# Patient Record
Sex: Male | Born: 1950 | Race: Black or African American | Hispanic: No | Marital: Married | Smoking: Never smoker
Health system: Southern US, Community
[De-identification: ages and names within clinical notes are randomized; demographics above are authoritative.]

## PROBLEM LIST (undated history)

## (undated) DIAGNOSIS — E119 Type 2 diabetes mellitus without complications: Secondary | ICD-10-CM

## (undated) DIAGNOSIS — I10 Essential (primary) hypertension: Secondary | ICD-10-CM

## (undated) DIAGNOSIS — I509 Heart failure, unspecified: Secondary | ICD-10-CM

## (undated) HISTORY — PX: ANTERIOR CRUCIATE LIGAMENT REPAIR: SHX115

---

## 2008-05-20 ENCOUNTER — Inpatient Hospital Stay: Payer: Self-pay | Admitting: Specialist

## 2012-04-01 DIAGNOSIS — Z125 Encounter for screening for malignant neoplasm of prostate: Secondary | ICD-10-CM | POA: Diagnosis not present

## 2012-04-01 DIAGNOSIS — E785 Hyperlipidemia, unspecified: Secondary | ICD-10-CM | POA: Diagnosis not present

## 2012-04-01 DIAGNOSIS — E119 Type 2 diabetes mellitus without complications: Secondary | ICD-10-CM | POA: Diagnosis not present

## 2012-04-01 DIAGNOSIS — R5383 Other fatigue: Secondary | ICD-10-CM | POA: Diagnosis not present

## 2012-04-01 DIAGNOSIS — M109 Gout, unspecified: Secondary | ICD-10-CM | POA: Diagnosis not present

## 2012-04-01 DIAGNOSIS — I10 Essential (primary) hypertension: Secondary | ICD-10-CM | POA: Diagnosis not present

## 2012-07-22 DIAGNOSIS — E119 Type 2 diabetes mellitus without complications: Secondary | ICD-10-CM | POA: Diagnosis not present

## 2012-07-22 DIAGNOSIS — H2510 Age-related nuclear cataract, unspecified eye: Secondary | ICD-10-CM | POA: Diagnosis not present

## 2012-07-22 DIAGNOSIS — H25019 Cortical age-related cataract, unspecified eye: Secondary | ICD-10-CM | POA: Diagnosis not present

## 2012-12-12 DIAGNOSIS — I10 Essential (primary) hypertension: Secondary | ICD-10-CM | POA: Diagnosis not present

## 2012-12-12 DIAGNOSIS — E119 Type 2 diabetes mellitus without complications: Secondary | ICD-10-CM | POA: Diagnosis not present

## 2012-12-12 DIAGNOSIS — R5383 Other fatigue: Secondary | ICD-10-CM | POA: Diagnosis not present

## 2012-12-12 DIAGNOSIS — E1169 Type 2 diabetes mellitus with other specified complication: Secondary | ICD-10-CM | POA: Diagnosis not present

## 2012-12-12 DIAGNOSIS — Z125 Encounter for screening for malignant neoplasm of prostate: Secondary | ICD-10-CM | POA: Diagnosis not present

## 2012-12-12 DIAGNOSIS — E785 Hyperlipidemia, unspecified: Secondary | ICD-10-CM | POA: Diagnosis not present

## 2012-12-12 DIAGNOSIS — M109 Gout, unspecified: Secondary | ICD-10-CM | POA: Diagnosis not present

## 2013-03-14 DIAGNOSIS — N529 Male erectile dysfunction, unspecified: Secondary | ICD-10-CM | POA: Diagnosis not present

## 2013-03-14 DIAGNOSIS — I10 Essential (primary) hypertension: Secondary | ICD-10-CM | POA: Diagnosis not present

## 2013-03-14 DIAGNOSIS — E785 Hyperlipidemia, unspecified: Secondary | ICD-10-CM | POA: Diagnosis not present

## 2013-06-17 DIAGNOSIS — E785 Hyperlipidemia, unspecified: Secondary | ICD-10-CM | POA: Diagnosis not present

## 2013-06-17 DIAGNOSIS — Z23 Encounter for immunization: Secondary | ICD-10-CM | POA: Diagnosis not present

## 2013-06-17 DIAGNOSIS — I10 Essential (primary) hypertension: Secondary | ICD-10-CM | POA: Diagnosis not present

## 2013-12-03 DIAGNOSIS — IMO0001 Reserved for inherently not codable concepts without codable children: Secondary | ICD-10-CM | POA: Diagnosis not present

## 2013-12-03 DIAGNOSIS — I10 Essential (primary) hypertension: Secondary | ICD-10-CM | POA: Diagnosis not present

## 2013-12-03 DIAGNOSIS — Z125 Encounter for screening for malignant neoplasm of prostate: Secondary | ICD-10-CM | POA: Diagnosis not present

## 2013-12-03 DIAGNOSIS — M109 Gout, unspecified: Secondary | ICD-10-CM | POA: Diagnosis not present

## 2013-12-03 DIAGNOSIS — E785 Hyperlipidemia, unspecified: Secondary | ICD-10-CM | POA: Diagnosis not present

## 2014-05-18 DIAGNOSIS — L0291 Cutaneous abscess, unspecified: Secondary | ICD-10-CM | POA: Diagnosis not present

## 2014-05-18 DIAGNOSIS — L0390 Cellulitis, unspecified: Secondary | ICD-10-CM | POA: Diagnosis not present

## 2014-05-28 DIAGNOSIS — L03211 Cellulitis of face: Secondary | ICD-10-CM | POA: Diagnosis not present

## 2014-05-28 DIAGNOSIS — L0201 Cutaneous abscess of face: Secondary | ICD-10-CM | POA: Diagnosis not present

## 2014-06-05 DIAGNOSIS — M109 Gout, unspecified: Secondary | ICD-10-CM | POA: Diagnosis not present

## 2014-06-05 DIAGNOSIS — E785 Hyperlipidemia, unspecified: Secondary | ICD-10-CM | POA: Diagnosis not present

## 2014-06-05 DIAGNOSIS — E1165 Type 2 diabetes mellitus with hyperglycemia: Secondary | ICD-10-CM | POA: Diagnosis not present

## 2014-06-05 DIAGNOSIS — I10 Essential (primary) hypertension: Secondary | ICD-10-CM | POA: Diagnosis not present

## 2015-01-26 DIAGNOSIS — Z1389 Encounter for screening for other disorder: Secondary | ICD-10-CM | POA: Diagnosis not present

## 2015-01-26 DIAGNOSIS — I10 Essential (primary) hypertension: Secondary | ICD-10-CM | POA: Diagnosis not present

## 2015-01-26 DIAGNOSIS — Z79899 Other long term (current) drug therapy: Secondary | ICD-10-CM | POA: Diagnosis not present

## 2015-01-26 DIAGNOSIS — Z6841 Body Mass Index (BMI) 40.0 and over, adult: Secondary | ICD-10-CM | POA: Diagnosis not present

## 2015-01-26 DIAGNOSIS — E1165 Type 2 diabetes mellitus with hyperglycemia: Secondary | ICD-10-CM | POA: Diagnosis not present

## 2015-01-26 DIAGNOSIS — E785 Hyperlipidemia, unspecified: Secondary | ICD-10-CM | POA: Diagnosis not present

## 2015-01-26 DIAGNOSIS — M109 Gout, unspecified: Secondary | ICD-10-CM | POA: Diagnosis not present

## 2015-04-23 DIAGNOSIS — N400 Enlarged prostate without lower urinary tract symptoms: Secondary | ICD-10-CM | POA: Diagnosis not present

## 2015-04-23 DIAGNOSIS — R140 Abdominal distension (gaseous): Secondary | ICD-10-CM | POA: Diagnosis not present

## 2015-04-23 DIAGNOSIS — R319 Hematuria, unspecified: Secondary | ICD-10-CM | POA: Diagnosis not present

## 2015-04-23 DIAGNOSIS — R509 Fever, unspecified: Secondary | ICD-10-CM | POA: Diagnosis not present

## 2015-04-23 DIAGNOSIS — K529 Noninfective gastroenteritis and colitis, unspecified: Secondary | ICD-10-CM | POA: Diagnosis not present

## 2015-04-23 DIAGNOSIS — R05 Cough: Secondary | ICD-10-CM | POA: Diagnosis not present

## 2015-04-24 DIAGNOSIS — R319 Hematuria, unspecified: Secondary | ICD-10-CM | POA: Diagnosis not present

## 2015-04-24 DIAGNOSIS — R140 Abdominal distension (gaseous): Secondary | ICD-10-CM | POA: Diagnosis not present

## 2015-04-24 DIAGNOSIS — N400 Enlarged prostate without lower urinary tract symptoms: Secondary | ICD-10-CM | POA: Diagnosis not present

## 2015-04-24 DIAGNOSIS — R05 Cough: Secondary | ICD-10-CM | POA: Diagnosis not present

## 2015-04-24 DIAGNOSIS — R509 Fever, unspecified: Secondary | ICD-10-CM | POA: Diagnosis not present

## 2015-06-29 DIAGNOSIS — Z6841 Body Mass Index (BMI) 40.0 and over, adult: Secondary | ICD-10-CM | POA: Diagnosis not present

## 2015-06-29 DIAGNOSIS — Z79899 Other long term (current) drug therapy: Secondary | ICD-10-CM | POA: Diagnosis not present

## 2015-06-29 DIAGNOSIS — E1165 Type 2 diabetes mellitus with hyperglycemia: Secondary | ICD-10-CM | POA: Diagnosis not present

## 2015-06-29 DIAGNOSIS — N529 Male erectile dysfunction, unspecified: Secondary | ICD-10-CM | POA: Diagnosis not present

## 2015-06-29 DIAGNOSIS — Z23 Encounter for immunization: Secondary | ICD-10-CM | POA: Diagnosis not present

## 2015-06-29 DIAGNOSIS — I10 Essential (primary) hypertension: Secondary | ICD-10-CM | POA: Diagnosis not present

## 2015-06-29 DIAGNOSIS — E785 Hyperlipidemia, unspecified: Secondary | ICD-10-CM | POA: Diagnosis not present

## 2015-06-29 DIAGNOSIS — M109 Gout, unspecified: Secondary | ICD-10-CM | POA: Diagnosis not present

## 2016-02-10 DIAGNOSIS — I10 Essential (primary) hypertension: Secondary | ICD-10-CM | POA: Diagnosis not present

## 2016-02-10 DIAGNOSIS — Z79899 Other long term (current) drug therapy: Secondary | ICD-10-CM | POA: Diagnosis not present

## 2016-02-10 DIAGNOSIS — Z6841 Body Mass Index (BMI) 40.0 and over, adult: Secondary | ICD-10-CM | POA: Diagnosis not present

## 2016-02-10 DIAGNOSIS — E1165 Type 2 diabetes mellitus with hyperglycemia: Secondary | ICD-10-CM | POA: Diagnosis not present

## 2016-02-10 DIAGNOSIS — Z1389 Encounter for screening for other disorder: Secondary | ICD-10-CM | POA: Diagnosis not present

## 2016-02-10 DIAGNOSIS — E785 Hyperlipidemia, unspecified: Secondary | ICD-10-CM | POA: Diagnosis not present

## 2016-02-10 DIAGNOSIS — N529 Male erectile dysfunction, unspecified: Secondary | ICD-10-CM | POA: Diagnosis not present

## 2016-02-10 DIAGNOSIS — Z125 Encounter for screening for malignant neoplasm of prostate: Secondary | ICD-10-CM | POA: Diagnosis not present

## 2016-02-10 DIAGNOSIS — Z9181 History of falling: Secondary | ICD-10-CM | POA: Diagnosis not present

## 2016-02-29 DIAGNOSIS — J208 Acute bronchitis due to other specified organisms: Secondary | ICD-10-CM | POA: Diagnosis not present

## 2016-02-29 DIAGNOSIS — R0602 Shortness of breath: Secondary | ICD-10-CM | POA: Diagnosis not present

## 2016-02-29 DIAGNOSIS — J181 Lobar pneumonia, unspecified organism: Secondary | ICD-10-CM | POA: Diagnosis not present

## 2016-02-29 DIAGNOSIS — R05 Cough: Secondary | ICD-10-CM | POA: Diagnosis not present

## 2016-03-16 DIAGNOSIS — I517 Cardiomegaly: Secondary | ICD-10-CM | POA: Diagnosis not present

## 2016-03-16 DIAGNOSIS — R05 Cough: Secondary | ICD-10-CM | POA: Diagnosis not present

## 2016-03-16 DIAGNOSIS — J189 Pneumonia, unspecified organism: Secondary | ICD-10-CM | POA: Diagnosis not present

## 2016-03-17 DIAGNOSIS — J189 Pneumonia, unspecified organism: Secondary | ICD-10-CM | POA: Diagnosis not present

## 2016-03-17 DIAGNOSIS — Z79899 Other long term (current) drug therapy: Secondary | ICD-10-CM | POA: Diagnosis not present

## 2016-03-17 DIAGNOSIS — Z6841 Body Mass Index (BMI) 40.0 and over, adult: Secondary | ICD-10-CM | POA: Diagnosis not present

## 2016-08-03 DIAGNOSIS — Z6841 Body Mass Index (BMI) 40.0 and over, adult: Secondary | ICD-10-CM | POA: Diagnosis not present

## 2016-08-03 DIAGNOSIS — E1165 Type 2 diabetes mellitus with hyperglycemia: Secondary | ICD-10-CM | POA: Diagnosis not present

## 2016-08-03 DIAGNOSIS — N529 Male erectile dysfunction, unspecified: Secondary | ICD-10-CM | POA: Diagnosis not present

## 2016-08-03 DIAGNOSIS — Z23 Encounter for immunization: Secondary | ICD-10-CM | POA: Diagnosis not present

## 2016-08-03 DIAGNOSIS — Z79899 Other long term (current) drug therapy: Secondary | ICD-10-CM | POA: Diagnosis not present

## 2016-08-03 DIAGNOSIS — E785 Hyperlipidemia, unspecified: Secondary | ICD-10-CM | POA: Diagnosis not present

## 2016-08-03 DIAGNOSIS — I10 Essential (primary) hypertension: Secondary | ICD-10-CM | POA: Diagnosis not present

## 2016-08-24 ENCOUNTER — Emergency Department (HOSPITAL_COMMUNITY)
Admission: EM | Admit: 2016-08-24 | Discharge: 2016-08-24 | Disposition: A | Discharge status: Home / Self Care | Payer: No Typology Code available for payment source | Attending: Emergency Medicine | Admitting: Emergency Medicine

## 2016-08-24 ENCOUNTER — Emergency Department (HOSPITAL_COMMUNITY): Payer: No Typology Code available for payment source

## 2016-08-24 ENCOUNTER — Encounter (HOSPITAL_COMMUNITY): Payer: Self-pay | Admitting: Emergency Medicine

## 2016-08-24 DIAGNOSIS — I509 Heart failure, unspecified: Secondary | ICD-10-CM | POA: Insufficient documentation

## 2016-08-24 DIAGNOSIS — E119 Type 2 diabetes mellitus without complications: Secondary | ICD-10-CM | POA: Insufficient documentation

## 2016-08-24 DIAGNOSIS — Y939 Activity, unspecified: Secondary | ICD-10-CM | POA: Diagnosis not present

## 2016-08-24 DIAGNOSIS — S01512A Laceration without foreign body of oral cavity, initial encounter: Secondary | ICD-10-CM | POA: Diagnosis not present

## 2016-08-24 DIAGNOSIS — Z79899 Other long term (current) drug therapy: Secondary | ICD-10-CM | POA: Diagnosis not present

## 2016-08-24 DIAGNOSIS — Y999 Unspecified external cause status: Secondary | ICD-10-CM | POA: Insufficient documentation

## 2016-08-24 DIAGNOSIS — R0781 Pleurodynia: Secondary | ICD-10-CM | POA: Diagnosis not present

## 2016-08-24 DIAGNOSIS — M542 Cervicalgia: Secondary | ICD-10-CM | POA: Diagnosis not present

## 2016-08-24 DIAGNOSIS — Y92410 Unspecified street and highway as the place of occurrence of the external cause: Secondary | ICD-10-CM | POA: Insufficient documentation

## 2016-08-24 DIAGNOSIS — S299XXA Unspecified injury of thorax, initial encounter: Secondary | ICD-10-CM | POA: Diagnosis not present

## 2016-08-24 DIAGNOSIS — T148XXA Other injury of unspecified body region, initial encounter: Secondary | ICD-10-CM | POA: Diagnosis not present

## 2016-08-24 DIAGNOSIS — M25552 Pain in left hip: Secondary | ICD-10-CM | POA: Insufficient documentation

## 2016-08-24 DIAGNOSIS — I110 Hypertensive heart disease with heart failure: Secondary | ICD-10-CM | POA: Insufficient documentation

## 2016-08-24 DIAGNOSIS — M545 Low back pain: Secondary | ICD-10-CM | POA: Diagnosis not present

## 2016-08-24 DIAGNOSIS — S79912A Unspecified injury of left hip, initial encounter: Secondary | ICD-10-CM | POA: Diagnosis not present

## 2016-08-24 DIAGNOSIS — S3992XA Unspecified injury of lower back, initial encounter: Secondary | ICD-10-CM | POA: Diagnosis not present

## 2016-08-24 HISTORY — DX: Type 2 diabetes mellitus without complications: E11.9

## 2016-08-24 HISTORY — DX: Heart failure, unspecified: I50.9

## 2016-08-24 HISTORY — DX: Essential (primary) hypertension: I10

## 2016-08-24 MED ORDER — LIDOCAINE 5 % EX PTCH: 1.0000 | MEDICATED_PATCH | CUTANEOUS | 0 refills | Status: AC

## 2016-08-24 MED ORDER — METHOCARBAMOL 500 MG PO TABS
1000.0000 mg | ORAL_TABLET | Freq: Once | ORAL | Status: AC
  Administered 2016-08-24: 1000 mg via ORAL
  Filled 2016-08-24: qty 2

## 2016-08-24 MED ORDER — METHOCARBAMOL 500 MG PO TABS: 500.0000 mg | ORAL_TABLET | Freq: Two times a day (BID) | ORAL | 0 refills | Status: DC

## 2016-08-24 MED ORDER — NAPROXEN 500 MG PO TABS: 500.0000 mg | ORAL_TABLET | Freq: Two times a day (BID) | ORAL | 0 refills | Status: DC

## 2016-08-24 MED ORDER — KETOROLAC TROMETHAMINE 60 MG/2ML IM SOLN
60.0000 mg | Freq: Once | INTRAMUSCULAR | Status: AC
  Administered 2016-08-24: 60 mg via INTRAMUSCULAR
  Filled 2016-08-24: qty 2

## 2016-08-24 NOTE — Discharge Instructions (Addendum)
There were no acute abnormalities on the x-rays today. Expect your soreness to increase over the next 2-3 days. Take it easy, but do not lay around too much as this may make any stiffness worse. Take 500 mg of naproxen every 12 hours or 800 mg of ibuprofen every 8 hours for the next 3 days. Take these medications with food to avoid upset stomach. Robaxin is a muscle relaxer and may help loosen stiff muscles. Do not take the Robaxin while driving or performing other dangerous activities. Be sure to perform the attached exercises starting with three times a week and working up to performing them daily. This is an essential part of preventing long term problems. Follow up with a primary care provider for any future management of these complaints.
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
## 2016-11-13 ENCOUNTER — Institutional Professional Consult (permissible substitution): Payer: Medicare Other | Admitting: Sports Medicine

## 2016-11-20 ENCOUNTER — Ambulatory Visit (INDEPENDENT_AMBULATORY_CARE_PROVIDER_SITE_OTHER): Payer: Medicare Other | Admitting: Sports Medicine

## 2016-11-20 DIAGNOSIS — M47816 Spondylosis without myelopathy or radiculopathy, lumbar region: Secondary | ICD-10-CM | POA: Diagnosis not present

## 2016-11-20 DIAGNOSIS — E882 Lipomatosis, not elsewhere classified: Secondary | ICD-10-CM | POA: Insufficient documentation

## 2016-11-20 DIAGNOSIS — D1779 Benign lipomatous neoplasm of other sites: Secondary | ICD-10-CM | POA: Diagnosis not present

## 2016-11-20 MED ORDER — MELOXICAM 15 MG PO TABS: ORAL_TABLET | ORAL | 3 refills | Status: AC

## 2016-11-20 NOTE — Assessment & Plan Note (Signed)
We can just watch this for now.

## 2016-11-20 NOTE — Assessment & Plan Note (Signed)
Referral to bariatric surgery, I would also like him to talk to his primary care provider about weight loss medications.
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
## 2016-11-20 NOTE — Assessment & Plan Note (Signed)
With facet arthritis at the bottom 2 levels, L5-S1 degenerative disc disease, and epidural lipomatosis. Overall doing well with very little pain, pain when present is discogenic. Nothing radicular, no cauda equina symptoms. Adding meloxicam, I would also like him to lose some weight.

## 2016-12-19 DIAGNOSIS — E785 Hyperlipidemia, unspecified: Secondary | ICD-10-CM | POA: Diagnosis not present

## 2016-12-19 DIAGNOSIS — M48061 Spinal stenosis, lumbar region without neurogenic claudication: Secondary | ICD-10-CM | POA: Diagnosis not present

## 2016-12-19 DIAGNOSIS — E1165 Type 2 diabetes mellitus with hyperglycemia: Secondary | ICD-10-CM | POA: Diagnosis not present

## 2016-12-19 DIAGNOSIS — Z6841 Body Mass Index (BMI) 40.0 and over, adult: Secondary | ICD-10-CM | POA: Diagnosis not present

## 2016-12-19 DIAGNOSIS — Z79899 Other long term (current) drug therapy: Secondary | ICD-10-CM | POA: Diagnosis not present

## 2016-12-19 DIAGNOSIS — R635 Abnormal weight gain: Secondary | ICD-10-CM | POA: Diagnosis not present

## 2016-12-19 DIAGNOSIS — N529 Male erectile dysfunction, unspecified: Secondary | ICD-10-CM | POA: Diagnosis not present

## 2016-12-19 DIAGNOSIS — I10 Essential (primary) hypertension: Secondary | ICD-10-CM | POA: Diagnosis not present

## 2017-01-18 DIAGNOSIS — E785 Hyperlipidemia, unspecified: Secondary | ICD-10-CM | POA: Diagnosis not present

## 2017-01-18 DIAGNOSIS — Z6841 Body Mass Index (BMI) 40.0 and over, adult: Secondary | ICD-10-CM | POA: Diagnosis not present

## 2017-01-18 DIAGNOSIS — N529 Male erectile dysfunction, unspecified: Secondary | ICD-10-CM | POA: Diagnosis not present

## 2017-01-18 DIAGNOSIS — E1165 Type 2 diabetes mellitus with hyperglycemia: Secondary | ICD-10-CM | POA: Diagnosis not present

## 2017-01-18 DIAGNOSIS — R635 Abnormal weight gain: Secondary | ICD-10-CM | POA: Diagnosis not present

## 2017-01-18 DIAGNOSIS — M48061 Spinal stenosis, lumbar region without neurogenic claudication: Secondary | ICD-10-CM | POA: Diagnosis not present

## 2017-01-18 DIAGNOSIS — Z79899 Other long term (current) drug therapy: Secondary | ICD-10-CM | POA: Diagnosis not present

## 2017-01-18 DIAGNOSIS — I10 Essential (primary) hypertension: Secondary | ICD-10-CM | POA: Diagnosis not present

## 2017-02-21 DIAGNOSIS — Z79899 Other long term (current) drug therapy: Secondary | ICD-10-CM | POA: Diagnosis not present

## 2017-02-21 DIAGNOSIS — Z6841 Body Mass Index (BMI) 40.0 and over, adult: Secondary | ICD-10-CM | POA: Diagnosis not present

## 2017-02-21 DIAGNOSIS — R635 Abnormal weight gain: Secondary | ICD-10-CM | POA: Diagnosis not present

## 2017-02-22 DIAGNOSIS — H25013 Cortical age-related cataract, bilateral: Secondary | ICD-10-CM | POA: Diagnosis not present

## 2017-02-22 DIAGNOSIS — E119 Type 2 diabetes mellitus without complications: Secondary | ICD-10-CM | POA: Diagnosis not present

## 2017-03-28 DIAGNOSIS — Z79899 Other long term (current) drug therapy: Secondary | ICD-10-CM | POA: Diagnosis not present

## 2017-03-28 DIAGNOSIS — Z1389 Encounter for screening for other disorder: Secondary | ICD-10-CM | POA: Diagnosis not present

## 2017-03-28 DIAGNOSIS — Z6841 Body Mass Index (BMI) 40.0 and over, adult: Secondary | ICD-10-CM | POA: Diagnosis not present

## 2017-03-28 DIAGNOSIS — R635 Abnormal weight gain: Secondary | ICD-10-CM | POA: Diagnosis not present

## 2017-03-28 DIAGNOSIS — Z9181 History of falling: Secondary | ICD-10-CM | POA: Diagnosis not present

## 2017-04-30 DIAGNOSIS — Z6841 Body Mass Index (BMI) 40.0 and over, adult: Secondary | ICD-10-CM | POA: Diagnosis not present

## 2017-04-30 DIAGNOSIS — Z79899 Other long term (current) drug therapy: Secondary | ICD-10-CM | POA: Diagnosis not present

## 2017-04-30 DIAGNOSIS — R635 Abnormal weight gain: Secondary | ICD-10-CM | POA: Diagnosis not present

## 2017-05-31 DIAGNOSIS — Z6841 Body Mass Index (BMI) 40.0 and over, adult: Secondary | ICD-10-CM | POA: Diagnosis not present

## 2017-05-31 DIAGNOSIS — I10 Essential (primary) hypertension: Secondary | ICD-10-CM | POA: Diagnosis not present

## 2017-05-31 DIAGNOSIS — Z125 Encounter for screening for malignant neoplasm of prostate: Secondary | ICD-10-CM | POA: Diagnosis not present

## 2017-05-31 DIAGNOSIS — E1165 Type 2 diabetes mellitus with hyperglycemia: Secondary | ICD-10-CM | POA: Diagnosis not present

## 2017-05-31 DIAGNOSIS — R635 Abnormal weight gain: Secondary | ICD-10-CM | POA: Diagnosis not present

## 2017-05-31 DIAGNOSIS — E785 Hyperlipidemia, unspecified: Secondary | ICD-10-CM | POA: Diagnosis not present

## 2017-05-31 DIAGNOSIS — Z79899 Other long term (current) drug therapy: Secondary | ICD-10-CM | POA: Diagnosis not present

## 2017-07-03 DIAGNOSIS — Z6841 Body Mass Index (BMI) 40.0 and over, adult: Secondary | ICD-10-CM | POA: Diagnosis not present

## 2017-07-03 DIAGNOSIS — R635 Abnormal weight gain: Secondary | ICD-10-CM | POA: Diagnosis not present

## 2017-07-03 DIAGNOSIS — Z79899 Other long term (current) drug therapy: Secondary | ICD-10-CM | POA: Diagnosis not present

## 2017-07-03 DIAGNOSIS — Z23 Encounter for immunization: Secondary | ICD-10-CM | POA: Diagnosis not present

## 2017-08-02 DIAGNOSIS — R635 Abnormal weight gain: Secondary | ICD-10-CM | POA: Diagnosis not present

## 2017-08-02 DIAGNOSIS — Z6841 Body Mass Index (BMI) 40.0 and over, adult: Secondary | ICD-10-CM | POA: Diagnosis not present

## 2017-08-02 DIAGNOSIS — Z79899 Other long term (current) drug therapy: Secondary | ICD-10-CM | POA: Diagnosis not present

## 2017-08-29 DIAGNOSIS — J069 Acute upper respiratory infection, unspecified: Secondary | ICD-10-CM | POA: Diagnosis not present

## 2017-08-29 DIAGNOSIS — Z6841 Body Mass Index (BMI) 40.0 and over, adult: Secondary | ICD-10-CM | POA: Diagnosis not present

## 2017-10-02 DIAGNOSIS — Z79899 Other long term (current) drug therapy: Secondary | ICD-10-CM | POA: Diagnosis not present

## 2017-10-02 DIAGNOSIS — R635 Abnormal weight gain: Secondary | ICD-10-CM | POA: Diagnosis not present

## 2017-10-02 DIAGNOSIS — Z6841 Body Mass Index (BMI) 40.0 and over, adult: Secondary | ICD-10-CM | POA: Diagnosis not present

## 2017-10-02 DIAGNOSIS — S91332A Puncture wound without foreign body, left foot, initial encounter: Secondary | ICD-10-CM | POA: Diagnosis not present

## 2017-11-06 DIAGNOSIS — R635 Abnormal weight gain: Secondary | ICD-10-CM | POA: Diagnosis not present

## 2017-11-06 DIAGNOSIS — S91312A Laceration without foreign body, left foot, initial encounter: Secondary | ICD-10-CM | POA: Diagnosis not present

## 2017-11-06 DIAGNOSIS — Z6841 Body Mass Index (BMI) 40.0 and over, adult: Secondary | ICD-10-CM | POA: Diagnosis not present

## 2017-11-06 DIAGNOSIS — Z79899 Other long term (current) drug therapy: Secondary | ICD-10-CM | POA: Diagnosis not present

## 2017-11-13 DIAGNOSIS — E785 Hyperlipidemia, unspecified: Secondary | ICD-10-CM | POA: Diagnosis not present

## 2017-11-13 DIAGNOSIS — E669 Obesity, unspecified: Secondary | ICD-10-CM | POA: Diagnosis not present

## 2017-11-13 DIAGNOSIS — Z0000 Encounter for general adult medical examination without abnormal findings: Secondary | ICD-10-CM | POA: Diagnosis not present

## 2017-11-13 DIAGNOSIS — Z125 Encounter for screening for malignant neoplasm of prostate: Secondary | ICD-10-CM | POA: Diagnosis not present

## 2017-11-13 DIAGNOSIS — Z6841 Body Mass Index (BMI) 40.0 and over, adult: Secondary | ICD-10-CM | POA: Diagnosis not present

## 2017-11-20 DIAGNOSIS — I10 Essential (primary) hypertension: Secondary | ICD-10-CM | POA: Diagnosis not present

## 2017-11-20 DIAGNOSIS — Z79899 Other long term (current) drug therapy: Secondary | ICD-10-CM | POA: Diagnosis not present

## 2017-11-20 DIAGNOSIS — Z6841 Body Mass Index (BMI) 40.0 and over, adult: Secondary | ICD-10-CM | POA: Diagnosis not present

## 2017-11-20 DIAGNOSIS — E785 Hyperlipidemia, unspecified: Secondary | ICD-10-CM | POA: Diagnosis not present

## 2017-11-20 DIAGNOSIS — E1165 Type 2 diabetes mellitus with hyperglycemia: Secondary | ICD-10-CM | POA: Diagnosis not present

## 2017-11-29 ENCOUNTER — Other Ambulatory Visit: Payer: Self-pay

## 2017-11-29 ENCOUNTER — Emergency Department (HOSPITAL_COMMUNITY): Payer: Medicare Other

## 2017-11-29 ENCOUNTER — Encounter (HOSPITAL_COMMUNITY): Payer: Self-pay | Admitting: Emergency Medicine

## 2017-11-29 ENCOUNTER — Emergency Department (HOSPITAL_COMMUNITY)
Admission: EM | Admit: 2017-11-29 | Discharge: 2017-11-30 | Disposition: A | Discharge status: Home / Self Care | Payer: Medicare Other | Attending: Emergency Medicine | Admitting: Emergency Medicine

## 2017-11-29 DIAGNOSIS — R0602 Shortness of breath: Secondary | ICD-10-CM | POA: Insufficient documentation

## 2017-11-29 DIAGNOSIS — M7989 Other specified soft tissue disorders: Secondary | ICD-10-CM | POA: Diagnosis not present

## 2017-11-29 DIAGNOSIS — R05 Cough: Secondary | ICD-10-CM | POA: Diagnosis not present

## 2017-11-29 DIAGNOSIS — Z5321 Procedure and treatment not carried out due to patient leaving prior to being seen by health care provider: Secondary | ICD-10-CM | POA: Diagnosis not present

## 2017-11-29 LAB — CBC
HEMATOCRIT: 35 % — AB (ref 39.0–52.0)
HEMOGLOBIN: 11.1 g/dL — AB (ref 13.0–17.0)
MCH: 26.2 pg (ref 26.0–34.0)
MCHC: 31.7 g/dL (ref 30.0–36.0)
MCV: 82.7 fL (ref 78.0–100.0)
Platelets: 226 10*3/uL (ref 150–400)
RBC: 4.23 MIL/uL (ref 4.22–5.81)
RDW: 16.2 % — AB (ref 11.5–15.5)
WBC: 7.3 10*3/uL (ref 4.0–10.5)

## 2017-11-29 LAB — BASIC METABOLIC PANEL
ANION GAP: 12 (ref 5–15)
BUN: 14 mg/dL (ref 6–20)
CALCIUM: 9 mg/dL (ref 8.9–10.3)
CHLORIDE: 100 mmol/L — AB (ref 101–111)
CO2: 26 mmol/L (ref 22–32)
Creatinine, Ser: 1.31 mg/dL — ABNORMAL HIGH (ref 0.61–1.24)
GFR calc non Af Amer: 56 mL/min — ABNORMAL LOW (ref >60)
GLUCOSE: 250 mg/dL — AB (ref 65–99)
POTASSIUM: 3.7 mmol/L (ref 3.5–5.1)
Sodium: 138 mmol/L (ref 135–145)

## 2017-11-29 LAB — BRAIN NATRIURETIC PEPTIDE: B Natriuretic Peptide: 256.4 pg/mL — ABNORMAL HIGH (ref 0.0–100.0)

## 2017-11-29 NOTE — ED Triage Notes (Signed)
Pt reports shortness of breath x1 week, progressively worse. States cough starting more recently. Dry cough. Reports bilateral leg swelling

## 2017-11-30 DIAGNOSIS — I10 Essential (primary) hypertension: Secondary | ICD-10-CM | POA: Diagnosis not present

## 2017-11-30 DIAGNOSIS — J90 Pleural effusion, not elsewhere classified: Secondary | ICD-10-CM | POA: Diagnosis not present

## 2017-11-30 DIAGNOSIS — E785 Hyperlipidemia, unspecified: Secondary | ICD-10-CM | POA: Diagnosis not present

## 2017-11-30 DIAGNOSIS — E1165 Type 2 diabetes mellitus with hyperglycemia: Secondary | ICD-10-CM | POA: Diagnosis not present

## 2017-11-30 DIAGNOSIS — Z6841 Body Mass Index (BMI) 40.0 and over, adult: Secondary | ICD-10-CM | POA: Diagnosis not present

## 2017-11-30 NOTE — ED Notes (Signed)
No answer when called by NT-Monique,RN

## 2017-12-06 DIAGNOSIS — Z6841 Body Mass Index (BMI) 40.0 and over, adult: Secondary | ICD-10-CM | POA: Diagnosis not present

## 2017-12-06 DIAGNOSIS — D649 Anemia, unspecified: Secondary | ICD-10-CM | POA: Diagnosis not present

## 2017-12-06 DIAGNOSIS — J90 Pleural effusion, not elsewhere classified: Secondary | ICD-10-CM | POA: Diagnosis not present

## 2017-12-14 DIAGNOSIS — Z7984 Long term (current) use of oral hypoglycemic drugs: Secondary | ICD-10-CM | POA: Diagnosis not present

## 2017-12-14 DIAGNOSIS — I110 Hypertensive heart disease with heart failure: Secondary | ICD-10-CM | POA: Diagnosis not present

## 2017-12-14 DIAGNOSIS — E11621 Type 2 diabetes mellitus with foot ulcer: Secondary | ICD-10-CM | POA: Diagnosis not present

## 2017-12-14 DIAGNOSIS — L97522 Non-pressure chronic ulcer of other part of left foot with fat layer exposed: Secondary | ICD-10-CM | POA: Diagnosis not present

## 2017-12-14 DIAGNOSIS — I509 Heart failure, unspecified: Secondary | ICD-10-CM | POA: Diagnosis not present

## 2017-12-14 DIAGNOSIS — M109 Gout, unspecified: Secondary | ICD-10-CM | POA: Diagnosis not present

## 2017-12-20 DIAGNOSIS — E11621 Type 2 diabetes mellitus with foot ulcer: Secondary | ICD-10-CM | POA: Diagnosis not present

## 2017-12-20 DIAGNOSIS — I10 Essential (primary) hypertension: Secondary | ICD-10-CM | POA: Diagnosis not present

## 2017-12-20 DIAGNOSIS — L97522 Non-pressure chronic ulcer of other part of left foot with fat layer exposed: Secondary | ICD-10-CM | POA: Diagnosis not present

## 2017-12-27 DIAGNOSIS — L97522 Non-pressure chronic ulcer of other part of left foot with fat layer exposed: Secondary | ICD-10-CM | POA: Diagnosis not present

## 2017-12-27 DIAGNOSIS — I10 Essential (primary) hypertension: Secondary | ICD-10-CM | POA: Diagnosis not present

## 2017-12-27 DIAGNOSIS — E11621 Type 2 diabetes mellitus with foot ulcer: Secondary | ICD-10-CM | POA: Diagnosis not present

## 2017-12-27 DIAGNOSIS — L97822 Non-pressure chronic ulcer of other part of left lower leg with fat layer exposed: Secondary | ICD-10-CM | POA: Diagnosis not present

## 2018-01-03 DIAGNOSIS — E11621 Type 2 diabetes mellitus with foot ulcer: Secondary | ICD-10-CM | POA: Diagnosis not present

## 2018-01-03 DIAGNOSIS — I10 Essential (primary) hypertension: Secondary | ICD-10-CM | POA: Diagnosis not present

## 2018-01-03 DIAGNOSIS — L97522 Non-pressure chronic ulcer of other part of left foot with fat layer exposed: Secondary | ICD-10-CM | POA: Diagnosis not present

## 2018-01-03 DIAGNOSIS — L97822 Non-pressure chronic ulcer of other part of left lower leg with fat layer exposed: Secondary | ICD-10-CM | POA: Diagnosis not present

## 2018-01-10 DIAGNOSIS — E11621 Type 2 diabetes mellitus with foot ulcer: Secondary | ICD-10-CM | POA: Diagnosis not present

## 2018-01-10 DIAGNOSIS — I10 Essential (primary) hypertension: Secondary | ICD-10-CM | POA: Diagnosis not present

## 2018-01-10 DIAGNOSIS — L97522 Non-pressure chronic ulcer of other part of left foot with fat layer exposed: Secondary | ICD-10-CM | POA: Diagnosis not present

## 2018-01-17 DIAGNOSIS — E11621 Type 2 diabetes mellitus with foot ulcer: Secondary | ICD-10-CM | POA: Diagnosis not present

## 2018-01-17 DIAGNOSIS — L97522 Non-pressure chronic ulcer of other part of left foot with fat layer exposed: Secondary | ICD-10-CM | POA: Diagnosis not present

## 2018-01-21 DIAGNOSIS — E11621 Type 2 diabetes mellitus with foot ulcer: Secondary | ICD-10-CM | POA: Diagnosis not present

## 2018-01-21 DIAGNOSIS — L97522 Non-pressure chronic ulcer of other part of left foot with fat layer exposed: Secondary | ICD-10-CM | POA: Diagnosis not present

## 2018-01-23 DIAGNOSIS — J90 Pleural effusion, not elsewhere classified: Secondary | ICD-10-CM | POA: Diagnosis not present

## 2018-01-23 DIAGNOSIS — R609 Edema, unspecified: Secondary | ICD-10-CM | POA: Diagnosis not present

## 2018-01-23 DIAGNOSIS — I509 Heart failure, unspecified: Secondary | ICD-10-CM | POA: Diagnosis not present

## 2018-01-23 DIAGNOSIS — Z1211 Encounter for screening for malignant neoplasm of colon: Secondary | ICD-10-CM | POA: Diagnosis not present

## 2018-01-24 DIAGNOSIS — E11621 Type 2 diabetes mellitus with foot ulcer: Secondary | ICD-10-CM | POA: Diagnosis not present

## 2018-01-24 DIAGNOSIS — L97522 Non-pressure chronic ulcer of other part of left foot with fat layer exposed: Secondary | ICD-10-CM | POA: Diagnosis not present

## 2018-01-29 DIAGNOSIS — L97522 Non-pressure chronic ulcer of other part of left foot with fat layer exposed: Secondary | ICD-10-CM | POA: Diagnosis not present

## 2018-01-29 DIAGNOSIS — E11621 Type 2 diabetes mellitus with foot ulcer: Secondary | ICD-10-CM | POA: Diagnosis not present

## 2018-01-31 DIAGNOSIS — E11621 Type 2 diabetes mellitus with foot ulcer: Secondary | ICD-10-CM | POA: Diagnosis not present

## 2018-01-31 DIAGNOSIS — E119 Type 2 diabetes mellitus without complications: Secondary | ICD-10-CM | POA: Diagnosis not present

## 2018-01-31 DIAGNOSIS — L97529 Non-pressure chronic ulcer of other part of left foot with unspecified severity: Secondary | ICD-10-CM | POA: Diagnosis not present

## 2018-02-04 DIAGNOSIS — E11621 Type 2 diabetes mellitus with foot ulcer: Secondary | ICD-10-CM | POA: Diagnosis not present

## 2018-02-04 DIAGNOSIS — L97522 Non-pressure chronic ulcer of other part of left foot with fat layer exposed: Secondary | ICD-10-CM | POA: Diagnosis not present

## 2018-02-06 DIAGNOSIS — Z79899 Other long term (current) drug therapy: Secondary | ICD-10-CM | POA: Diagnosis not present

## 2018-02-06 DIAGNOSIS — R609 Edema, unspecified: Secondary | ICD-10-CM | POA: Diagnosis not present

## 2018-02-06 DIAGNOSIS — Z6841 Body Mass Index (BMI) 40.0 and over, adult: Secondary | ICD-10-CM | POA: Diagnosis not present

## 2018-02-13 DIAGNOSIS — L97522 Non-pressure chronic ulcer of other part of left foot with fat layer exposed: Secondary | ICD-10-CM | POA: Diagnosis not present

## 2018-02-13 DIAGNOSIS — E11621 Type 2 diabetes mellitus with foot ulcer: Secondary | ICD-10-CM | POA: Diagnosis not present

## 2018-02-20 DIAGNOSIS — Z09 Encounter for follow-up examination after completed treatment for conditions other than malignant neoplasm: Secondary | ICD-10-CM | POA: Diagnosis not present

## 2018-02-20 DIAGNOSIS — I10 Essential (primary) hypertension: Secondary | ICD-10-CM | POA: Diagnosis not present

## 2018-02-20 DIAGNOSIS — L97529 Non-pressure chronic ulcer of other part of left foot with unspecified severity: Secondary | ICD-10-CM | POA: Diagnosis not present

## 2018-02-20 DIAGNOSIS — E119 Type 2 diabetes mellitus without complications: Secondary | ICD-10-CM | POA: Diagnosis not present

## 2018-02-20 DIAGNOSIS — Z872 Personal history of diseases of the skin and subcutaneous tissue: Secondary | ICD-10-CM | POA: Diagnosis not present

## 2018-05-07 DIAGNOSIS — Z6841 Body Mass Index (BMI) 40.0 and over, adult: Secondary | ICD-10-CM | POA: Diagnosis not present

## 2018-05-07 DIAGNOSIS — E785 Hyperlipidemia, unspecified: Secondary | ICD-10-CM | POA: Diagnosis not present

## 2018-05-07 DIAGNOSIS — R0602 Shortness of breath: Secondary | ICD-10-CM | POA: Diagnosis not present

## 2018-05-07 DIAGNOSIS — E119 Type 2 diabetes mellitus without complications: Secondary | ICD-10-CM | POA: Diagnosis not present

## 2018-05-08 DIAGNOSIS — I454 Nonspecific intraventricular block: Secondary | ICD-10-CM | POA: Diagnosis not present

## 2018-05-13 DIAGNOSIS — E785 Hyperlipidemia, unspecified: Secondary | ICD-10-CM | POA: Diagnosis not present

## 2018-05-13 DIAGNOSIS — R0602 Shortness of breath: Secondary | ICD-10-CM | POA: Diagnosis not present

## 2018-05-29 DIAGNOSIS — R5383 Other fatigue: Secondary | ICD-10-CM | POA: Diagnosis not present

## 2018-05-29 DIAGNOSIS — E559 Vitamin D deficiency, unspecified: Secondary | ICD-10-CM | POA: Diagnosis not present

## 2018-05-29 DIAGNOSIS — J4530 Mild persistent asthma, uncomplicated: Secondary | ICD-10-CM | POA: Diagnosis not present

## 2018-05-29 DIAGNOSIS — G4733 Obstructive sleep apnea (adult) (pediatric): Secondary | ICD-10-CM | POA: Diagnosis not present

## 2018-06-11 DIAGNOSIS — E119 Type 2 diabetes mellitus without complications: Secondary | ICD-10-CM | POA: Diagnosis not present

## 2018-06-11 DIAGNOSIS — Z6841 Body Mass Index (BMI) 40.0 and over, adult: Secondary | ICD-10-CM | POA: Diagnosis not present

## 2018-06-11 DIAGNOSIS — E785 Hyperlipidemia, unspecified: Secondary | ICD-10-CM | POA: Diagnosis not present

## 2018-06-11 DIAGNOSIS — I420 Dilated cardiomyopathy: Secondary | ICD-10-CM | POA: Diagnosis not present

## 2018-06-12 DIAGNOSIS — Z6841 Body Mass Index (BMI) 40.0 and over, adult: Secondary | ICD-10-CM | POA: Diagnosis not present

## 2018-06-12 DIAGNOSIS — Z23 Encounter for immunization: Secondary | ICD-10-CM | POA: Diagnosis not present

## 2018-06-12 DIAGNOSIS — I10 Essential (primary) hypertension: Secondary | ICD-10-CM | POA: Diagnosis not present

## 2018-06-12 DIAGNOSIS — E785 Hyperlipidemia, unspecified: Secondary | ICD-10-CM | POA: Diagnosis not present

## 2018-06-12 DIAGNOSIS — E1165 Type 2 diabetes mellitus with hyperglycemia: Secondary | ICD-10-CM | POA: Diagnosis not present

## 2018-06-17 DIAGNOSIS — I509 Heart failure, unspecified: Secondary | ICD-10-CM | POA: Diagnosis not present

## 2018-06-17 DIAGNOSIS — I272 Pulmonary hypertension, unspecified: Secondary | ICD-10-CM | POA: Diagnosis not present

## 2018-06-17 DIAGNOSIS — I2510 Atherosclerotic heart disease of native coronary artery without angina pectoris: Secondary | ICD-10-CM | POA: Diagnosis not present

## 2018-06-17 DIAGNOSIS — I501 Left ventricular failure: Secondary | ICD-10-CM | POA: Diagnosis not present

## 2018-06-25 DIAGNOSIS — E785 Hyperlipidemia, unspecified: Secondary | ICD-10-CM | POA: Diagnosis not present

## 2018-06-25 DIAGNOSIS — D508 Other iron deficiency anemias: Secondary | ICD-10-CM | POA: Diagnosis not present

## 2018-06-25 DIAGNOSIS — E1122 Type 2 diabetes mellitus with diabetic chronic kidney disease: Secondary | ICD-10-CM | POA: Diagnosis not present

## 2018-06-25 DIAGNOSIS — Z794 Long term (current) use of insulin: Secondary | ICD-10-CM | POA: Diagnosis not present

## 2018-06-25 DIAGNOSIS — Z79899 Other long term (current) drug therapy: Secondary | ICD-10-CM | POA: Diagnosis not present

## 2018-06-25 DIAGNOSIS — Z6841 Body Mass Index (BMI) 40.0 and over, adult: Secondary | ICD-10-CM | POA: Diagnosis not present

## 2018-06-25 DIAGNOSIS — Z7982 Long term (current) use of aspirin: Secondary | ICD-10-CM | POA: Diagnosis not present

## 2018-06-25 DIAGNOSIS — I130 Hypertensive heart and chronic kidney disease with heart failure and stage 1 through stage 4 chronic kidney disease, or unspecified chronic kidney disease: Secondary | ICD-10-CM | POA: Diagnosis not present

## 2018-06-25 DIAGNOSIS — N183 Chronic kidney disease, stage 3 (moderate): Secondary | ICD-10-CM | POA: Diagnosis not present

## 2018-06-25 DIAGNOSIS — I5022 Chronic systolic (congestive) heart failure: Secondary | ICD-10-CM | POA: Diagnosis not present

## 2018-06-25 DIAGNOSIS — E119 Type 2 diabetes mellitus without complications: Secondary | ICD-10-CM | POA: Diagnosis not present

## 2018-06-25 DIAGNOSIS — Z7984 Long term (current) use of oral hypoglycemic drugs: Secondary | ICD-10-CM | POA: Diagnosis not present

## 2018-06-25 DIAGNOSIS — G4733 Obstructive sleep apnea (adult) (pediatric): Secondary | ICD-10-CM | POA: Diagnosis not present

## 2018-06-25 DIAGNOSIS — M109 Gout, unspecified: Secondary | ICD-10-CM | POA: Diagnosis not present

## 2018-06-28 DIAGNOSIS — G4733 Obstructive sleep apnea (adult) (pediatric): Secondary | ICD-10-CM | POA: Diagnosis not present

## 2018-06-28 DIAGNOSIS — E559 Vitamin D deficiency, unspecified: Secondary | ICD-10-CM | POA: Diagnosis not present

## 2018-06-28 DIAGNOSIS — I509 Heart failure, unspecified: Secondary | ICD-10-CM | POA: Diagnosis not present

## 2018-06-28 DIAGNOSIS — J4530 Mild persistent asthma, uncomplicated: Secondary | ICD-10-CM | POA: Diagnosis not present

## 2018-07-09 DIAGNOSIS — I420 Dilated cardiomyopathy: Secondary | ICD-10-CM | POA: Diagnosis not present

## 2018-07-09 DIAGNOSIS — I10 Essential (primary) hypertension: Secondary | ICD-10-CM | POA: Diagnosis not present

## 2018-07-09 DIAGNOSIS — E785 Hyperlipidemia, unspecified: Secondary | ICD-10-CM | POA: Diagnosis not present

## 2018-07-09 DIAGNOSIS — E119 Type 2 diabetes mellitus without complications: Secondary | ICD-10-CM | POA: Diagnosis not present

## 2018-07-15 DIAGNOSIS — Z79899 Other long term (current) drug therapy: Secondary | ICD-10-CM | POA: Diagnosis not present

## 2018-07-15 DIAGNOSIS — E785 Hyperlipidemia, unspecified: Secondary | ICD-10-CM | POA: Diagnosis not present

## 2018-07-15 DIAGNOSIS — M109 Gout, unspecified: Secondary | ICD-10-CM | POA: Diagnosis not present

## 2018-07-15 DIAGNOSIS — Z6841 Body Mass Index (BMI) 40.0 and over, adult: Secondary | ICD-10-CM | POA: Diagnosis not present

## 2018-07-15 DIAGNOSIS — Z7984 Long term (current) use of oral hypoglycemic drugs: Secondary | ICD-10-CM | POA: Diagnosis not present

## 2018-07-15 DIAGNOSIS — I130 Hypertensive heart and chronic kidney disease with heart failure and stage 1 through stage 4 chronic kidney disease, or unspecified chronic kidney disease: Secondary | ICD-10-CM | POA: Diagnosis not present

## 2018-07-15 DIAGNOSIS — E559 Vitamin D deficiency, unspecified: Secondary | ICD-10-CM | POA: Diagnosis not present

## 2018-07-15 DIAGNOSIS — N183 Chronic kidney disease, stage 3 (moderate): Secondary | ICD-10-CM | POA: Diagnosis not present

## 2018-07-15 DIAGNOSIS — D508 Other iron deficiency anemias: Secondary | ICD-10-CM | POA: Diagnosis not present

## 2018-07-15 DIAGNOSIS — Z7982 Long term (current) use of aspirin: Secondary | ICD-10-CM | POA: Diagnosis not present

## 2018-07-15 DIAGNOSIS — I110 Hypertensive heart disease with heart failure: Secondary | ICD-10-CM | POA: Diagnosis not present

## 2018-07-15 DIAGNOSIS — I5022 Chronic systolic (congestive) heart failure: Secondary | ICD-10-CM | POA: Diagnosis not present

## 2018-07-15 DIAGNOSIS — E1122 Type 2 diabetes mellitus with diabetic chronic kidney disease: Secondary | ICD-10-CM | POA: Diagnosis not present

## 2018-07-15 DIAGNOSIS — E119 Type 2 diabetes mellitus without complications: Secondary | ICD-10-CM | POA: Diagnosis not present

## 2018-07-18 DIAGNOSIS — G4733 Obstructive sleep apnea (adult) (pediatric): Secondary | ICD-10-CM | POA: Diagnosis not present

## 2018-07-24 DIAGNOSIS — J4530 Mild persistent asthma, uncomplicated: Secondary | ICD-10-CM | POA: Diagnosis not present

## 2018-07-24 DIAGNOSIS — G4733 Obstructive sleep apnea (adult) (pediatric): Secondary | ICD-10-CM | POA: Diagnosis not present

## 2018-07-24 DIAGNOSIS — I509 Heart failure, unspecified: Secondary | ICD-10-CM | POA: Diagnosis not present

## 2018-07-24 DIAGNOSIS — E559 Vitamin D deficiency, unspecified: Secondary | ICD-10-CM | POA: Diagnosis not present

## 2018-08-07 DIAGNOSIS — I5022 Chronic systolic (congestive) heart failure: Secondary | ICD-10-CM | POA: Diagnosis not present

## 2018-08-07 DIAGNOSIS — E559 Vitamin D deficiency, unspecified: Secondary | ICD-10-CM | POA: Diagnosis not present

## 2018-08-07 DIAGNOSIS — E119 Type 2 diabetes mellitus without complications: Secondary | ICD-10-CM | POA: Diagnosis not present

## 2018-08-07 DIAGNOSIS — E785 Hyperlipidemia, unspecified: Secondary | ICD-10-CM | POA: Diagnosis not present

## 2018-08-07 DIAGNOSIS — I110 Hypertensive heart disease with heart failure: Secondary | ICD-10-CM | POA: Diagnosis not present

## 2018-08-07 DIAGNOSIS — D508 Other iron deficiency anemias: Secondary | ICD-10-CM | POA: Diagnosis not present

## 2018-08-07 DIAGNOSIS — N183 Chronic kidney disease, stage 3 (moderate): Secondary | ICD-10-CM | POA: Diagnosis not present

## 2018-09-09 DIAGNOSIS — Z8249 Family history of ischemic heart disease and other diseases of the circulatory system: Secondary | ICD-10-CM | POA: Diagnosis not present

## 2018-09-09 DIAGNOSIS — I130 Hypertensive heart and chronic kidney disease with heart failure and stage 1 through stage 4 chronic kidney disease, or unspecified chronic kidney disease: Secondary | ICD-10-CM | POA: Diagnosis not present

## 2018-09-09 DIAGNOSIS — E1122 Type 2 diabetes mellitus with diabetic chronic kidney disease: Secondary | ICD-10-CM | POA: Diagnosis not present

## 2018-09-09 DIAGNOSIS — Z79899 Other long term (current) drug therapy: Secondary | ICD-10-CM | POA: Diagnosis not present

## 2018-09-09 DIAGNOSIS — M109 Gout, unspecified: Secondary | ICD-10-CM | POA: Diagnosis not present

## 2018-09-09 DIAGNOSIS — N183 Chronic kidney disease, stage 3 (moderate): Secondary | ICD-10-CM | POA: Diagnosis not present

## 2018-09-09 DIAGNOSIS — E785 Hyperlipidemia, unspecified: Secondary | ICD-10-CM | POA: Diagnosis not present

## 2018-09-09 DIAGNOSIS — E119 Type 2 diabetes mellitus without complications: Secondary | ICD-10-CM | POA: Diagnosis not present

## 2018-09-09 DIAGNOSIS — I2510 Atherosclerotic heart disease of native coronary artery without angina pectoris: Secondary | ICD-10-CM | POA: Diagnosis not present

## 2018-09-09 DIAGNOSIS — E876 Hypokalemia: Secondary | ICD-10-CM | POA: Diagnosis not present

## 2018-09-09 DIAGNOSIS — Z7984 Long term (current) use of oral hypoglycemic drugs: Secondary | ICD-10-CM | POA: Diagnosis not present

## 2018-09-09 DIAGNOSIS — D508 Other iron deficiency anemias: Secondary | ICD-10-CM | POA: Diagnosis not present

## 2018-09-09 DIAGNOSIS — Z6841 Body Mass Index (BMI) 40.0 and over, adult: Secondary | ICD-10-CM | POA: Diagnosis not present

## 2018-09-09 DIAGNOSIS — J45909 Unspecified asthma, uncomplicated: Secondary | ICD-10-CM | POA: Diagnosis not present

## 2018-09-09 DIAGNOSIS — Z7982 Long term (current) use of aspirin: Secondary | ICD-10-CM | POA: Diagnosis not present

## 2018-09-09 DIAGNOSIS — E559 Vitamin D deficiency, unspecified: Secondary | ICD-10-CM | POA: Diagnosis not present

## 2018-09-09 DIAGNOSIS — I5022 Chronic systolic (congestive) heart failure: Secondary | ICD-10-CM | POA: Diagnosis not present

## 2018-09-13 DIAGNOSIS — E559 Vitamin D deficiency, unspecified: Secondary | ICD-10-CM | POA: Diagnosis not present

## 2018-09-13 DIAGNOSIS — I509 Heart failure, unspecified: Secondary | ICD-10-CM | POA: Diagnosis not present

## 2018-09-13 DIAGNOSIS — J4530 Mild persistent asthma, uncomplicated: Secondary | ICD-10-CM | POA: Diagnosis not present

## 2018-09-13 DIAGNOSIS — G4733 Obstructive sleep apnea (adult) (pediatric): Secondary | ICD-10-CM | POA: Diagnosis not present

## 2018-09-16 DIAGNOSIS — Z79899 Other long term (current) drug therapy: Secondary | ICD-10-CM | POA: Diagnosis not present

## 2018-09-16 DIAGNOSIS — D508 Other iron deficiency anemias: Secondary | ICD-10-CM | POA: Diagnosis not present

## 2018-09-25 DIAGNOSIS — Z1212 Encounter for screening for malignant neoplasm of rectum: Secondary | ICD-10-CM | POA: Diagnosis not present

## 2018-09-25 DIAGNOSIS — Z1211 Encounter for screening for malignant neoplasm of colon: Secondary | ICD-10-CM | POA: Diagnosis not present

## 2018-10-21 DIAGNOSIS — I10 Essential (primary) hypertension: Secondary | ICD-10-CM | POA: Diagnosis not present

## 2018-10-21 DIAGNOSIS — E1165 Type 2 diabetes mellitus with hyperglycemia: Secondary | ICD-10-CM | POA: Diagnosis not present

## 2018-10-21 DIAGNOSIS — E785 Hyperlipidemia, unspecified: Secondary | ICD-10-CM | POA: Diagnosis not present

## 2018-10-21 DIAGNOSIS — Z23 Encounter for immunization: Secondary | ICD-10-CM | POA: Diagnosis not present

## 2018-12-10 IMAGING — DX DG CHEST 2V
2 series · 2 of 2 positions shown · non-contrast
Comparison: August 24, 2016

CLINICAL DATA: Shortness of Breath

EXAM:
CHEST - 2 VIEW

[chest pa]
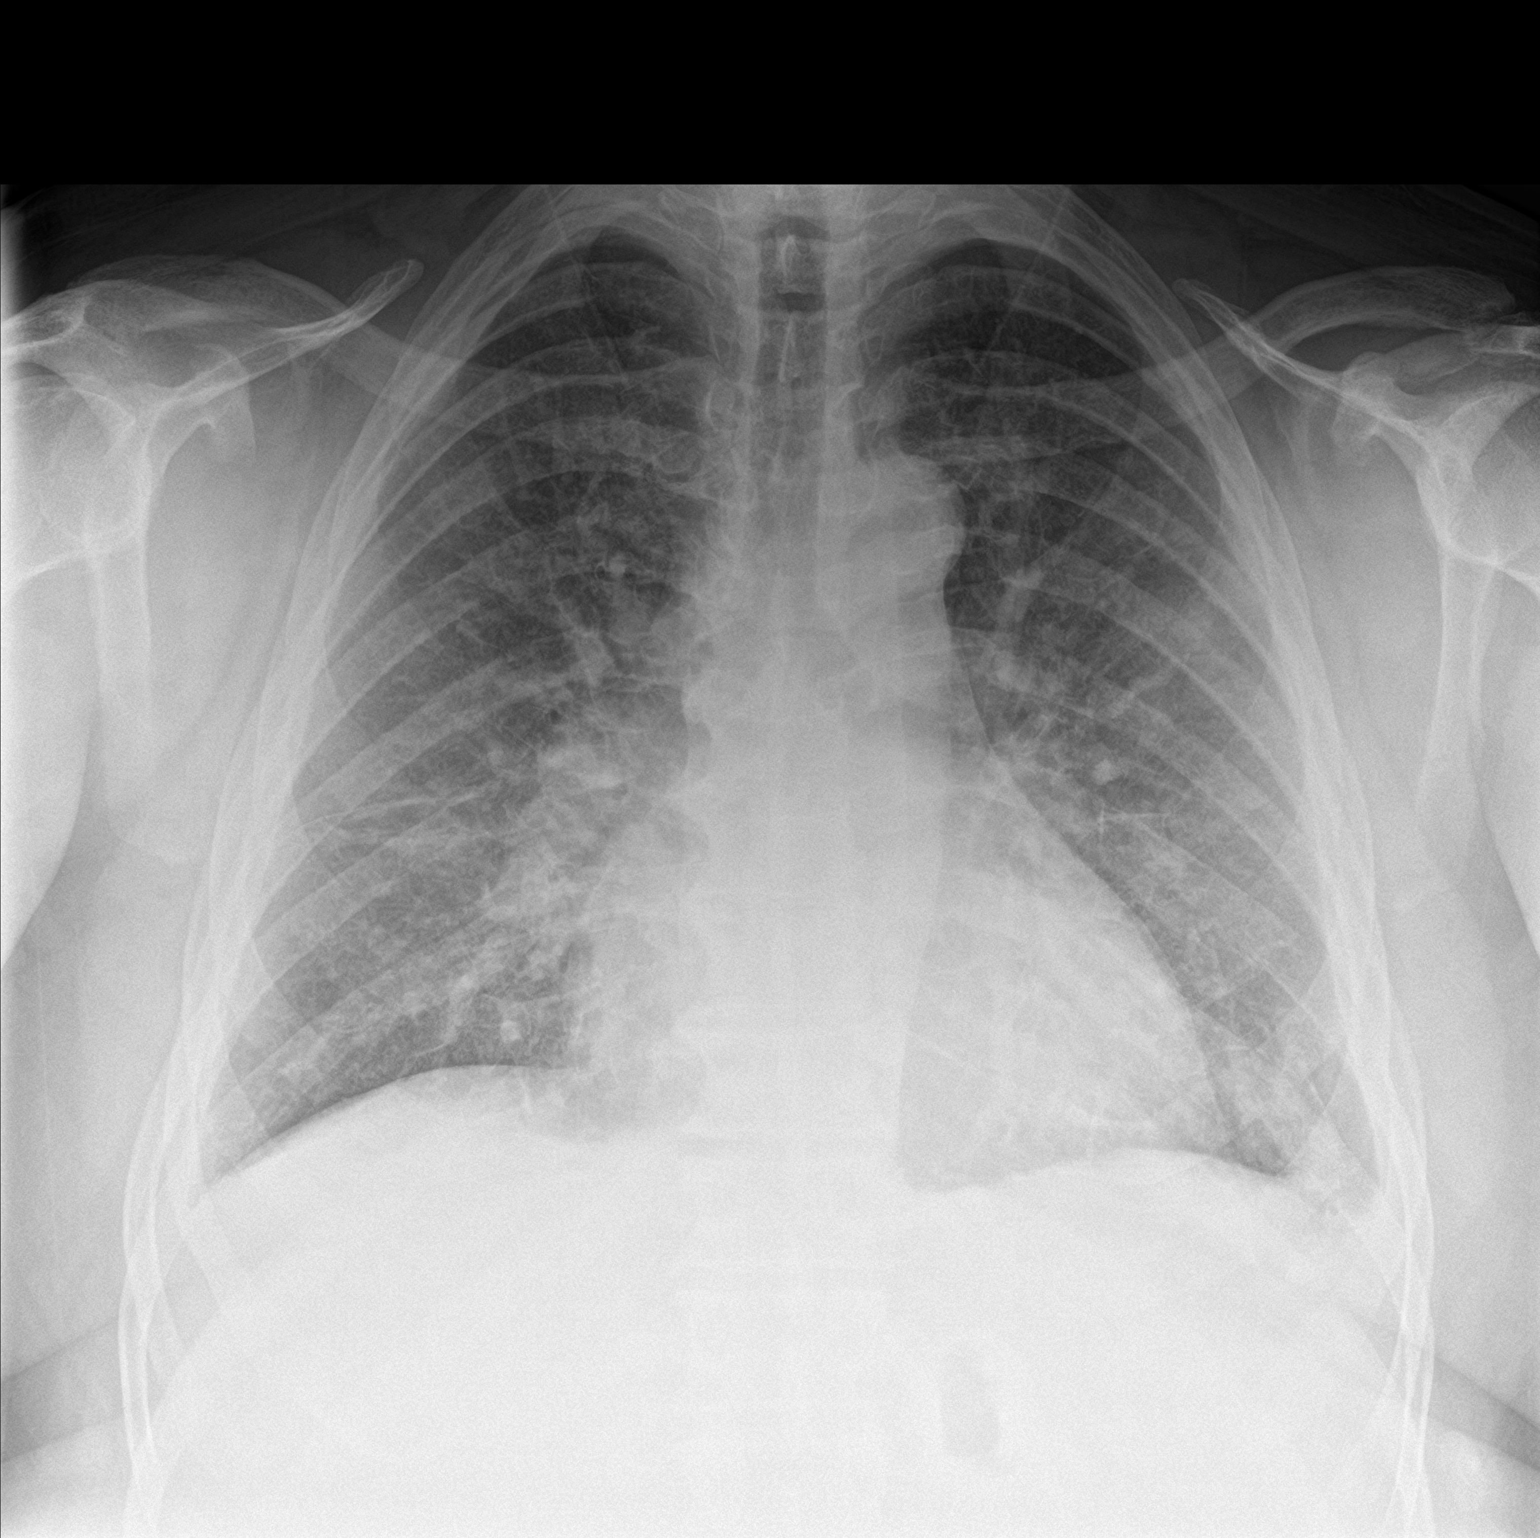

[chest lat]
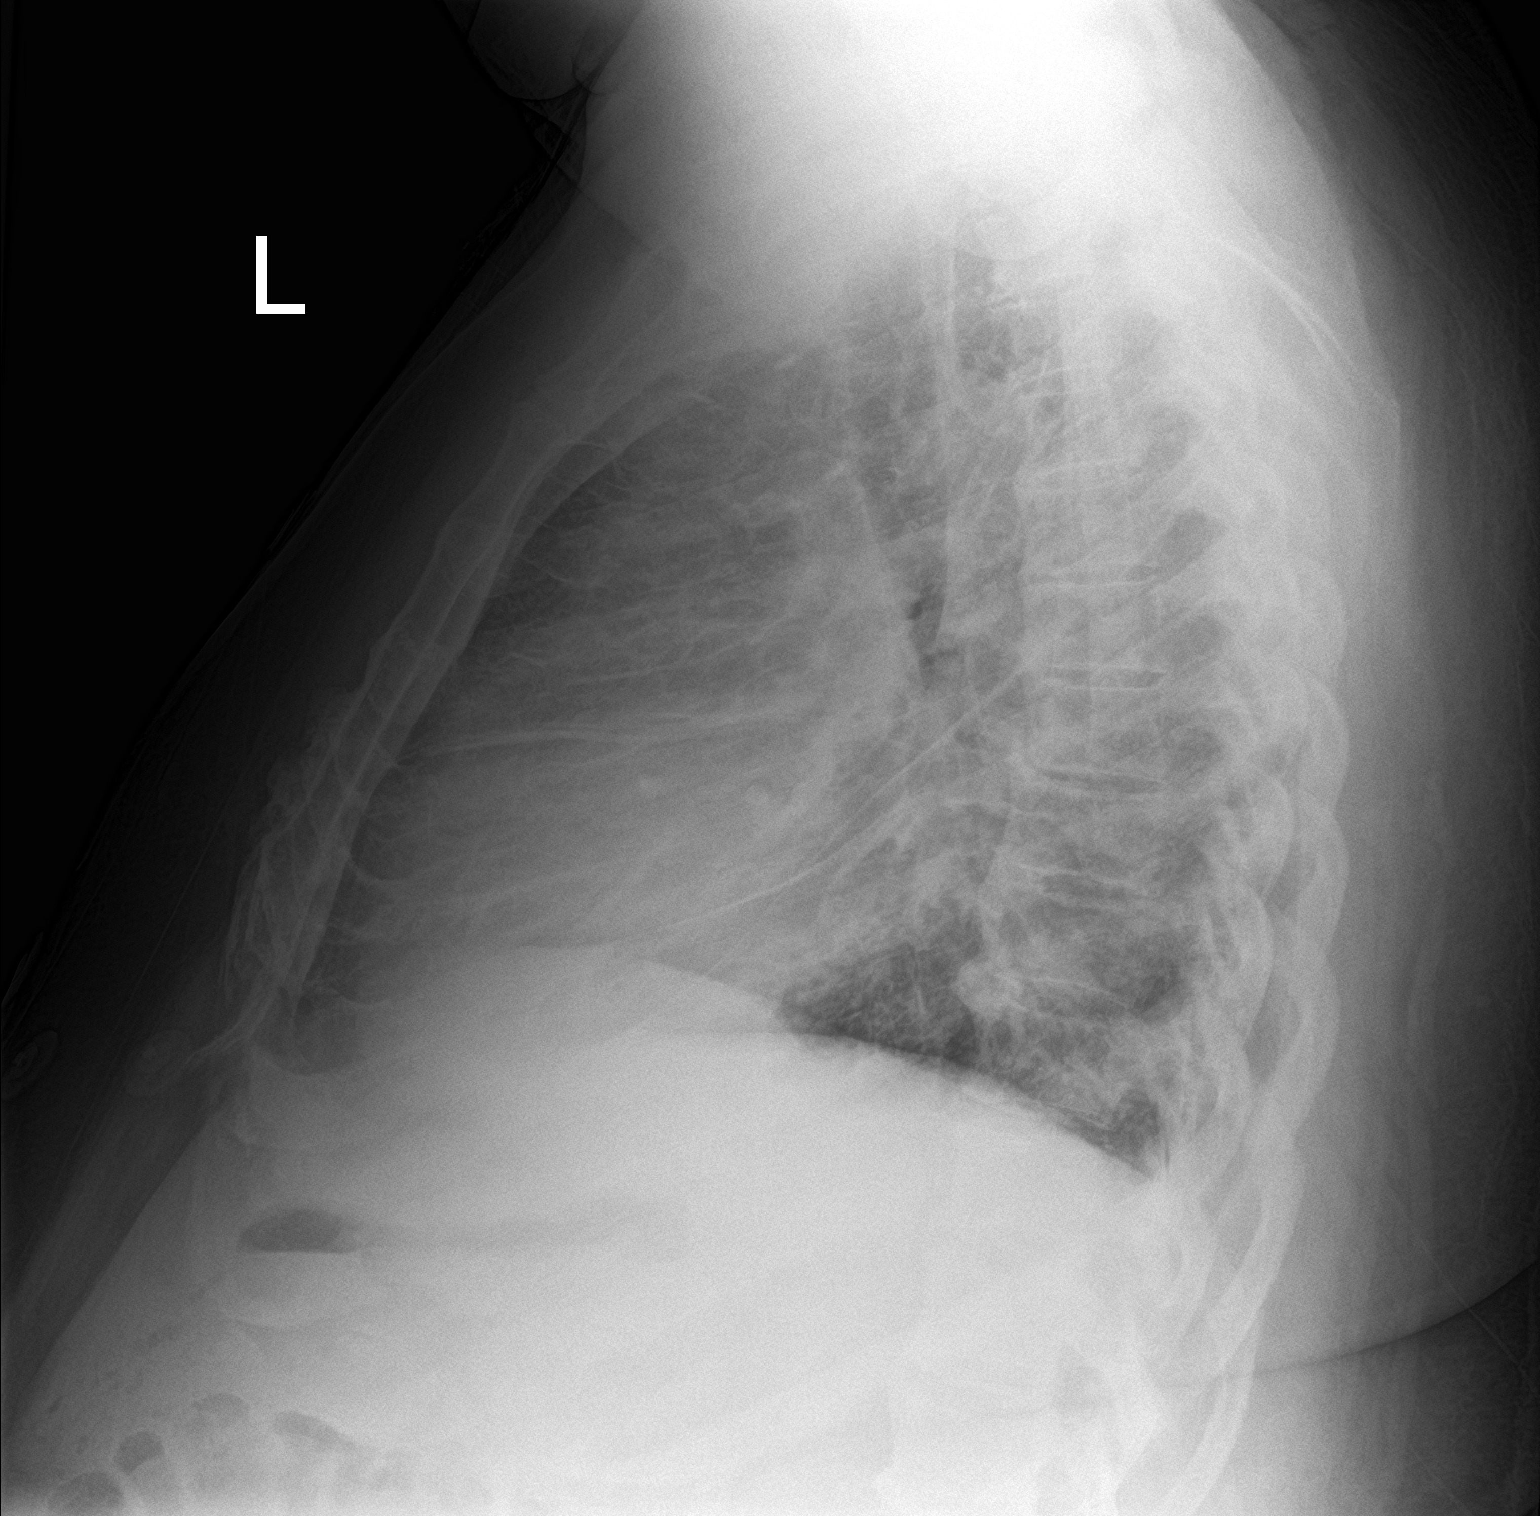

[2 of 2 positions shown; findings below may reference images not displayed]

FINDINGS: There is a minimal left pleural effusion. There is no edema or
consolidation. Heart is upper normal in size with pulmonary
vascularity within normal limits. No adenopathy. There is aortic
atherosclerosis. No bone lesions.
IMPRESSION: Minimal left pleural effusion. No edema or consolidation. Heart
upper normal in size. Aortic atherosclerosis evident.

Aortic Atherosclerosis ([id number]).

## 2019-02-24 DIAGNOSIS — L97222 Non-pressure chronic ulcer of left calf with fat layer exposed: Secondary | ICD-10-CM | POA: Diagnosis not present

## 2019-02-24 DIAGNOSIS — I872 Venous insufficiency (chronic) (peripheral): Secondary | ICD-10-CM | POA: Diagnosis not present

## 2019-02-24 DIAGNOSIS — E11622 Type 2 diabetes mellitus with other skin ulcer: Secondary | ICD-10-CM | POA: Diagnosis not present

## 2019-02-24 DIAGNOSIS — I10 Essential (primary) hypertension: Secondary | ICD-10-CM | POA: Diagnosis not present

## 2019-02-26 DIAGNOSIS — E11622 Type 2 diabetes mellitus with other skin ulcer: Secondary | ICD-10-CM | POA: Diagnosis not present

## 2019-02-26 DIAGNOSIS — L97222 Non-pressure chronic ulcer of left calf with fat layer exposed: Secondary | ICD-10-CM | POA: Diagnosis not present

## 2019-03-03 DIAGNOSIS — L97222 Non-pressure chronic ulcer of left calf with fat layer exposed: Secondary | ICD-10-CM | POA: Diagnosis not present

## 2019-03-03 DIAGNOSIS — E11622 Type 2 diabetes mellitus with other skin ulcer: Secondary | ICD-10-CM | POA: Diagnosis not present

## 2019-03-06 DIAGNOSIS — L97229 Non-pressure chronic ulcer of left calf with unspecified severity: Secondary | ICD-10-CM | POA: Diagnosis not present

## 2019-03-06 DIAGNOSIS — E11622 Type 2 diabetes mellitus with other skin ulcer: Secondary | ICD-10-CM | POA: Diagnosis not present

## 2019-03-10 DIAGNOSIS — L97222 Non-pressure chronic ulcer of left calf with fat layer exposed: Secondary | ICD-10-CM | POA: Diagnosis not present

## 2019-03-10 DIAGNOSIS — E11622 Type 2 diabetes mellitus with other skin ulcer: Secondary | ICD-10-CM | POA: Diagnosis not present

## 2019-03-13 DIAGNOSIS — L97222 Non-pressure chronic ulcer of left calf with fat layer exposed: Secondary | ICD-10-CM | POA: Diagnosis not present

## 2019-03-13 DIAGNOSIS — E11622 Type 2 diabetes mellitus with other skin ulcer: Secondary | ICD-10-CM | POA: Diagnosis not present

## 2019-03-17 DIAGNOSIS — E11622 Type 2 diabetes mellitus with other skin ulcer: Secondary | ICD-10-CM | POA: Diagnosis not present

## 2019-03-17 DIAGNOSIS — L97222 Non-pressure chronic ulcer of left calf with fat layer exposed: Secondary | ICD-10-CM | POA: Diagnosis not present

## 2019-03-20 DIAGNOSIS — L97222 Non-pressure chronic ulcer of left calf with fat layer exposed: Secondary | ICD-10-CM | POA: Diagnosis not present

## 2019-03-20 DIAGNOSIS — E11622 Type 2 diabetes mellitus with other skin ulcer: Secondary | ICD-10-CM | POA: Diagnosis not present

## 2019-03-24 DIAGNOSIS — I10 Essential (primary) hypertension: Secondary | ICD-10-CM | POA: Diagnosis not present

## 2019-03-24 DIAGNOSIS — I87312 Chronic venous hypertension (idiopathic) with ulcer of left lower extremity: Secondary | ICD-10-CM | POA: Diagnosis not present

## 2019-03-24 DIAGNOSIS — Z1331 Encounter for screening for depression: Secondary | ICD-10-CM | POA: Diagnosis not present

## 2019-03-24 DIAGNOSIS — E785 Hyperlipidemia, unspecified: Secondary | ICD-10-CM | POA: Diagnosis not present

## 2019-03-24 DIAGNOSIS — E1165 Type 2 diabetes mellitus with hyperglycemia: Secondary | ICD-10-CM | POA: Diagnosis not present

## 2019-03-24 DIAGNOSIS — L97222 Non-pressure chronic ulcer of left calf with fat layer exposed: Secondary | ICD-10-CM | POA: Diagnosis not present

## 2019-03-24 DIAGNOSIS — Z9181 History of falling: Secondary | ICD-10-CM | POA: Diagnosis not present

## 2019-03-24 DIAGNOSIS — Z6841 Body Mass Index (BMI) 40.0 and over, adult: Secondary | ICD-10-CM | POA: Diagnosis not present

## 2019-03-24 DIAGNOSIS — Z139 Encounter for screening, unspecified: Secondary | ICD-10-CM | POA: Diagnosis not present

## 2019-03-24 DIAGNOSIS — E11622 Type 2 diabetes mellitus with other skin ulcer: Secondary | ICD-10-CM | POA: Diagnosis not present

## 2019-03-27 DIAGNOSIS — L97222 Non-pressure chronic ulcer of left calf with fat layer exposed: Secondary | ICD-10-CM | POA: Diagnosis not present

## 2019-03-27 DIAGNOSIS — E11622 Type 2 diabetes mellitus with other skin ulcer: Secondary | ICD-10-CM | POA: Diagnosis not present

## 2019-03-31 DIAGNOSIS — L97222 Non-pressure chronic ulcer of left calf with fat layer exposed: Secondary | ICD-10-CM | POA: Diagnosis not present

## 2019-03-31 DIAGNOSIS — E11622 Type 2 diabetes mellitus with other skin ulcer: Secondary | ICD-10-CM | POA: Diagnosis not present

## 2019-04-03 DIAGNOSIS — E11622 Type 2 diabetes mellitus with other skin ulcer: Secondary | ICD-10-CM | POA: Diagnosis not present

## 2019-04-03 DIAGNOSIS — L97229 Non-pressure chronic ulcer of left calf with unspecified severity: Secondary | ICD-10-CM | POA: Diagnosis not present

## 2019-04-07 DIAGNOSIS — L97229 Non-pressure chronic ulcer of left calf with unspecified severity: Secondary | ICD-10-CM | POA: Diagnosis not present

## 2019-04-07 DIAGNOSIS — E11622 Type 2 diabetes mellitus with other skin ulcer: Secondary | ICD-10-CM | POA: Diagnosis not present

## 2019-04-07 DIAGNOSIS — L97222 Non-pressure chronic ulcer of left calf with fat layer exposed: Secondary | ICD-10-CM | POA: Diagnosis not present

## 2019-04-10 DIAGNOSIS — L97229 Non-pressure chronic ulcer of left calf with unspecified severity: Secondary | ICD-10-CM | POA: Diagnosis not present

## 2019-04-10 DIAGNOSIS — E11622 Type 2 diabetes mellitus with other skin ulcer: Secondary | ICD-10-CM | POA: Diagnosis not present

## 2019-05-28 DIAGNOSIS — Z0000 Encounter for general adult medical examination without abnormal findings: Secondary | ICD-10-CM | POA: Diagnosis not present

## 2019-05-28 DIAGNOSIS — Z9181 History of falling: Secondary | ICD-10-CM | POA: Diagnosis not present

## 2019-05-28 DIAGNOSIS — Z1331 Encounter for screening for depression: Secondary | ICD-10-CM | POA: Diagnosis not present

## 2019-05-28 DIAGNOSIS — Z6841 Body Mass Index (BMI) 40.0 and over, adult: Secondary | ICD-10-CM | POA: Diagnosis not present

## 2019-05-28 DIAGNOSIS — Z125 Encounter for screening for malignant neoplasm of prostate: Secondary | ICD-10-CM | POA: Diagnosis not present

## 2019-05-28 DIAGNOSIS — E785 Hyperlipidemia, unspecified: Secondary | ICD-10-CM | POA: Diagnosis not present

## 2019-06-20 DIAGNOSIS — Z23 Encounter for immunization: Secondary | ICD-10-CM | POA: Diagnosis not present

## 2019-08-06 DIAGNOSIS — I10 Essential (primary) hypertension: Secondary | ICD-10-CM | POA: Diagnosis not present

## 2019-08-06 DIAGNOSIS — E785 Hyperlipidemia, unspecified: Secondary | ICD-10-CM | POA: Diagnosis not present

## 2019-08-06 DIAGNOSIS — E1165 Type 2 diabetes mellitus with hyperglycemia: Secondary | ICD-10-CM | POA: Diagnosis not present

## 2019-08-06 DIAGNOSIS — Z6841 Body Mass Index (BMI) 40.0 and over, adult: Secondary | ICD-10-CM | POA: Diagnosis not present

## 2019-09-11 DIAGNOSIS — Z79899 Other long term (current) drug therapy: Secondary | ICD-10-CM | POA: Diagnosis not present

## 2019-09-11 DIAGNOSIS — Z6841 Body Mass Index (BMI) 40.0 and over, adult: Secondary | ICD-10-CM | POA: Diagnosis not present

## 2019-09-11 DIAGNOSIS — J0100 Acute maxillary sinusitis, unspecified: Secondary | ICD-10-CM | POA: Diagnosis not present

## 2020-04-15 DIAGNOSIS — I10 Essential (primary) hypertension: Secondary | ICD-10-CM | POA: Diagnosis not present

## 2020-04-15 DIAGNOSIS — E785 Hyperlipidemia, unspecified: Secondary | ICD-10-CM | POA: Diagnosis not present

## 2020-04-15 DIAGNOSIS — E1165 Type 2 diabetes mellitus with hyperglycemia: Secondary | ICD-10-CM | POA: Diagnosis not present

## 2020-04-15 DIAGNOSIS — Z79899 Other long term (current) drug therapy: Secondary | ICD-10-CM | POA: Diagnosis not present

## 2020-04-15 DIAGNOSIS — Z139 Encounter for screening, unspecified: Secondary | ICD-10-CM | POA: Diagnosis not present

## 2020-04-15 DIAGNOSIS — Z6841 Body Mass Index (BMI) 40.0 and over, adult: Secondary | ICD-10-CM | POA: Diagnosis not present

## 2020-04-15 DIAGNOSIS — Z125 Encounter for screening for malignant neoplasm of prostate: Secondary | ICD-10-CM | POA: Diagnosis not present

## 2020-07-12 DIAGNOSIS — Z23 Encounter for immunization: Secondary | ICD-10-CM | POA: Diagnosis not present

## 2020-10-20 DIAGNOSIS — Z9181 History of falling: Secondary | ICD-10-CM | POA: Diagnosis not present

## 2020-10-20 DIAGNOSIS — Z0000 Encounter for general adult medical examination without abnormal findings: Secondary | ICD-10-CM | POA: Diagnosis not present

## 2020-10-20 DIAGNOSIS — E669 Obesity, unspecified: Secondary | ICD-10-CM | POA: Diagnosis not present

## 2020-10-20 DIAGNOSIS — E785 Hyperlipidemia, unspecified: Secondary | ICD-10-CM | POA: Diagnosis not present

## 2020-10-20 DIAGNOSIS — Z1331 Encounter for screening for depression: Secondary | ICD-10-CM | POA: Diagnosis not present

## 2020-10-29 DIAGNOSIS — G4733 Obstructive sleep apnea (adult) (pediatric): Secondary | ICD-10-CM | POA: Diagnosis not present

## 2020-11-03 DIAGNOSIS — E1165 Type 2 diabetes mellitus with hyperglycemia: Secondary | ICD-10-CM | POA: Diagnosis not present

## 2020-11-03 DIAGNOSIS — Z79899 Other long term (current) drug therapy: Secondary | ICD-10-CM | POA: Diagnosis not present

## 2020-11-03 DIAGNOSIS — E785 Hyperlipidemia, unspecified: Secondary | ICD-10-CM | POA: Diagnosis not present

## 2020-11-03 DIAGNOSIS — N529 Male erectile dysfunction, unspecified: Secondary | ICD-10-CM | POA: Diagnosis not present

## 2020-11-03 DIAGNOSIS — I10 Essential (primary) hypertension: Secondary | ICD-10-CM | POA: Diagnosis not present

## 2020-11-03 DIAGNOSIS — Z23 Encounter for immunization: Secondary | ICD-10-CM | POA: Diagnosis not present

## 2020-11-03 DIAGNOSIS — Z6841 Body Mass Index (BMI) 40.0 and over, adult: Secondary | ICD-10-CM | POA: Diagnosis not present

## 2020-11-11 DIAGNOSIS — N183 Chronic kidney disease, stage 3 unspecified: Secondary | ICD-10-CM | POA: Diagnosis not present

## 2020-11-11 DIAGNOSIS — Z79899 Other long term (current) drug therapy: Secondary | ICD-10-CM | POA: Diagnosis not present

## 2020-11-11 DIAGNOSIS — Z6841 Body Mass Index (BMI) 40.0 and over, adult: Secondary | ICD-10-CM | POA: Diagnosis not present

## 2020-11-11 DIAGNOSIS — E1165 Type 2 diabetes mellitus with hyperglycemia: Secondary | ICD-10-CM | POA: Diagnosis not present

## 2020-11-11 DIAGNOSIS — E1129 Type 2 diabetes mellitus with other diabetic kidney complication: Secondary | ICD-10-CM | POA: Diagnosis not present

## 2020-11-26 DIAGNOSIS — G4733 Obstructive sleep apnea (adult) (pediatric): Secondary | ICD-10-CM | POA: Diagnosis not present

## 2020-12-13 DIAGNOSIS — N183 Chronic kidney disease, stage 3 unspecified: Secondary | ICD-10-CM | POA: Diagnosis not present

## 2020-12-27 DIAGNOSIS — G4733 Obstructive sleep apnea (adult) (pediatric): Secondary | ICD-10-CM | POA: Diagnosis not present

## 2021-01-05 DIAGNOSIS — N183 Chronic kidney disease, stage 3 unspecified: Secondary | ICD-10-CM | POA: Diagnosis not present

## 2021-02-02 DIAGNOSIS — N529 Male erectile dysfunction, unspecified: Secondary | ICD-10-CM | POA: Diagnosis not present

## 2021-02-02 DIAGNOSIS — E785 Hyperlipidemia, unspecified: Secondary | ICD-10-CM | POA: Diagnosis not present

## 2021-02-02 DIAGNOSIS — Z7689 Persons encountering health services in other specified circumstances: Secondary | ICD-10-CM | POA: Diagnosis not present

## 2021-02-02 DIAGNOSIS — Z6841 Body Mass Index (BMI) 40.0 and over, adult: Secondary | ICD-10-CM | POA: Diagnosis not present

## 2021-02-02 DIAGNOSIS — Z79899 Other long term (current) drug therapy: Secondary | ICD-10-CM | POA: Diagnosis not present

## 2021-02-02 DIAGNOSIS — I10 Essential (primary) hypertension: Secondary | ICD-10-CM | POA: Diagnosis not present

## 2021-02-02 DIAGNOSIS — E1165 Type 2 diabetes mellitus with hyperglycemia: Secondary | ICD-10-CM | POA: Diagnosis not present

## 2021-02-22 DIAGNOSIS — G4733 Obstructive sleep apnea (adult) (pediatric): Secondary | ICD-10-CM | POA: Diagnosis not present

## 2021-03-24 DIAGNOSIS — G4733 Obstructive sleep apnea (adult) (pediatric): Secondary | ICD-10-CM | POA: Diagnosis not present

## 2021-04-24 DIAGNOSIS — G4733 Obstructive sleep apnea (adult) (pediatric): Secondary | ICD-10-CM | POA: Diagnosis not present

## 2021-05-05 DIAGNOSIS — E785 Hyperlipidemia, unspecified: Secondary | ICD-10-CM | POA: Diagnosis not present

## 2021-05-05 DIAGNOSIS — I10 Essential (primary) hypertension: Secondary | ICD-10-CM | POA: Diagnosis not present

## 2021-05-05 DIAGNOSIS — N529 Male erectile dysfunction, unspecified: Secondary | ICD-10-CM | POA: Diagnosis not present

## 2021-05-05 DIAGNOSIS — Z6841 Body Mass Index (BMI) 40.0 and over, adult: Secondary | ICD-10-CM | POA: Diagnosis not present

## 2021-05-05 DIAGNOSIS — Z7689 Persons encountering health services in other specified circumstances: Secondary | ICD-10-CM | POA: Diagnosis not present

## 2021-05-05 DIAGNOSIS — Z139 Encounter for screening, unspecified: Secondary | ICD-10-CM | POA: Diagnosis not present

## 2021-05-05 DIAGNOSIS — Z79899 Other long term (current) drug therapy: Secondary | ICD-10-CM | POA: Diagnosis not present

## 2021-07-20 DIAGNOSIS — Z23 Encounter for immunization: Secondary | ICD-10-CM | POA: Diagnosis not present

## 2021-08-18 DIAGNOSIS — Z6841 Body Mass Index (BMI) 40.0 and over, adult: Secondary | ICD-10-CM | POA: Diagnosis not present

## 2021-08-18 DIAGNOSIS — Z79899 Other long term (current) drug therapy: Secondary | ICD-10-CM | POA: Diagnosis not present

## 2021-08-18 DIAGNOSIS — E785 Hyperlipidemia, unspecified: Secondary | ICD-10-CM | POA: Diagnosis not present

## 2021-08-18 DIAGNOSIS — I10 Essential (primary) hypertension: Secondary | ICD-10-CM | POA: Diagnosis not present

## 2021-08-18 DIAGNOSIS — N529 Male erectile dysfunction, unspecified: Secondary | ICD-10-CM | POA: Diagnosis not present

## 2021-08-18 DIAGNOSIS — E1165 Type 2 diabetes mellitus with hyperglycemia: Secondary | ICD-10-CM | POA: Diagnosis not present

## 2021-10-25 DIAGNOSIS — Z9181 History of falling: Secondary | ICD-10-CM | POA: Diagnosis not present

## 2021-10-25 DIAGNOSIS — E785 Hyperlipidemia, unspecified: Secondary | ICD-10-CM | POA: Diagnosis not present

## 2021-10-25 DIAGNOSIS — Z1331 Encounter for screening for depression: Secondary | ICD-10-CM | POA: Diagnosis not present

## 2021-10-25 DIAGNOSIS — Z6841 Body Mass Index (BMI) 40.0 and over, adult: Secondary | ICD-10-CM | POA: Diagnosis not present

## 2021-10-25 DIAGNOSIS — E669 Obesity, unspecified: Secondary | ICD-10-CM | POA: Diagnosis not present

## 2021-10-25 DIAGNOSIS — Z0000 Encounter for general adult medical examination without abnormal findings: Secondary | ICD-10-CM | POA: Diagnosis not present

## 2022-01-23 DIAGNOSIS — I10 Essential (primary) hypertension: Secondary | ICD-10-CM | POA: Diagnosis not present

## 2022-01-23 DIAGNOSIS — E785 Hyperlipidemia, unspecified: Secondary | ICD-10-CM | POA: Diagnosis not present

## 2022-01-23 DIAGNOSIS — N529 Male erectile dysfunction, unspecified: Secondary | ICD-10-CM | POA: Diagnosis not present

## 2022-01-23 DIAGNOSIS — E1165 Type 2 diabetes mellitus with hyperglycemia: Secondary | ICD-10-CM | POA: Diagnosis not present

## 2022-01-23 DIAGNOSIS — Z6841 Body Mass Index (BMI) 40.0 and over, adult: Secondary | ICD-10-CM | POA: Diagnosis not present

## 2022-02-22 DIAGNOSIS — N183 Chronic kidney disease, stage 3 unspecified: Secondary | ICD-10-CM | POA: Diagnosis not present

## 2022-04-28 DIAGNOSIS — E1122 Type 2 diabetes mellitus with diabetic chronic kidney disease: Secondary | ICD-10-CM | POA: Diagnosis not present

## 2022-04-28 DIAGNOSIS — N189 Chronic kidney disease, unspecified: Secondary | ICD-10-CM | POA: Diagnosis not present

## 2022-04-28 DIAGNOSIS — D631 Anemia in chronic kidney disease: Secondary | ICD-10-CM | POA: Diagnosis not present

## 2022-04-28 DIAGNOSIS — G4733 Obstructive sleep apnea (adult) (pediatric): Secondary | ICD-10-CM | POA: Diagnosis not present

## 2022-04-28 DIAGNOSIS — M1000 Idiopathic gout, unspecified site: Secondary | ICD-10-CM | POA: Diagnosis not present

## 2022-04-28 DIAGNOSIS — N184 Chronic kidney disease, stage 4 (severe): Secondary | ICD-10-CM | POA: Diagnosis not present

## 2022-04-28 DIAGNOSIS — I509 Heart failure, unspecified: Secondary | ICD-10-CM | POA: Diagnosis not present

## 2022-04-28 DIAGNOSIS — I129 Hypertensive chronic kidney disease with stage 1 through stage 4 chronic kidney disease, or unspecified chronic kidney disease: Secondary | ICD-10-CM | POA: Diagnosis not present

## 2022-05-01 ENCOUNTER — Other Ambulatory Visit: Payer: Self-pay | Admitting: Nephrology

## 2022-05-01 DIAGNOSIS — N184 Chronic kidney disease, stage 4 (severe): Secondary | ICD-10-CM

## 2022-05-02 ENCOUNTER — Ambulatory Visit
Admission: RE | Admit: 2022-05-02 | Discharge: 2022-05-02 | Disposition: A | Discharge status: Home / Self Care | Payer: Medicare HMO | Source: Ambulatory Visit | Attending: Nephrology | Admitting: Nephrology

## 2022-05-02 DIAGNOSIS — N184 Chronic kidney disease, stage 4 (severe): Secondary | ICD-10-CM

## 2022-05-02 DIAGNOSIS — N189 Chronic kidney disease, unspecified: Secondary | ICD-10-CM | POA: Diagnosis not present

## 2022-05-23 DIAGNOSIS — Z6841 Body Mass Index (BMI) 40.0 and over, adult: Secondary | ICD-10-CM | POA: Diagnosis not present

## 2022-05-23 DIAGNOSIS — E1165 Type 2 diabetes mellitus with hyperglycemia: Secondary | ICD-10-CM | POA: Diagnosis not present

## 2022-05-23 DIAGNOSIS — E785 Hyperlipidemia, unspecified: Secondary | ICD-10-CM | POA: Diagnosis not present

## 2022-05-23 DIAGNOSIS — N529 Male erectile dysfunction, unspecified: Secondary | ICD-10-CM | POA: Diagnosis not present

## 2022-05-23 DIAGNOSIS — Z139 Encounter for screening, unspecified: Secondary | ICD-10-CM | POA: Diagnosis not present

## 2022-08-16 DIAGNOSIS — E785 Hyperlipidemia, unspecified: Secondary | ICD-10-CM | POA: Diagnosis not present

## 2022-08-16 DIAGNOSIS — N529 Male erectile dysfunction, unspecified: Secondary | ICD-10-CM | POA: Diagnosis not present

## 2022-08-16 DIAGNOSIS — E1165 Type 2 diabetes mellitus with hyperglycemia: Secondary | ICD-10-CM | POA: Diagnosis not present

## 2022-08-16 DIAGNOSIS — I10 Essential (primary) hypertension: Secondary | ICD-10-CM | POA: Diagnosis not present

## 2022-08-16 DIAGNOSIS — Z6841 Body Mass Index (BMI) 40.0 and over, adult: Secondary | ICD-10-CM | POA: Diagnosis not present

## 2022-08-22 DIAGNOSIS — E1122 Type 2 diabetes mellitus with diabetic chronic kidney disease: Secondary | ICD-10-CM | POA: Diagnosis not present

## 2022-08-22 DIAGNOSIS — R809 Proteinuria, unspecified: Secondary | ICD-10-CM | POA: Diagnosis not present

## 2022-08-22 DIAGNOSIS — I509 Heart failure, unspecified: Secondary | ICD-10-CM | POA: Diagnosis not present

## 2022-08-22 DIAGNOSIS — I129 Hypertensive chronic kidney disease with stage 1 through stage 4 chronic kidney disease, or unspecified chronic kidney disease: Secondary | ICD-10-CM | POA: Diagnosis not present

## 2022-08-22 DIAGNOSIS — G4733 Obstructive sleep apnea (adult) (pediatric): Secondary | ICD-10-CM | POA: Diagnosis not present

## 2022-08-22 DIAGNOSIS — D631 Anemia in chronic kidney disease: Secondary | ICD-10-CM | POA: Diagnosis not present

## 2022-08-22 DIAGNOSIS — E559 Vitamin D deficiency, unspecified: Secondary | ICD-10-CM | POA: Diagnosis not present

## 2022-08-22 DIAGNOSIS — N184 Chronic kidney disease, stage 4 (severe): Secondary | ICD-10-CM | POA: Diagnosis not present

## 2022-10-25 DIAGNOSIS — Z6841 Body Mass Index (BMI) 40.0 and over, adult: Secondary | ICD-10-CM | POA: Diagnosis not present

## 2022-10-25 DIAGNOSIS — L0201 Cutaneous abscess of face: Secondary | ICD-10-CM | POA: Diagnosis not present

## 2022-11-21 DIAGNOSIS — L0201 Cutaneous abscess of face: Secondary | ICD-10-CM | POA: Diagnosis not present

## 2022-11-21 DIAGNOSIS — L280 Lichen simplex chronicus: Secondary | ICD-10-CM | POA: Diagnosis not present

## 2022-12-12 DIAGNOSIS — L720 Epidermal cyst: Secondary | ICD-10-CM | POA: Diagnosis not present

## 2022-12-27 DIAGNOSIS — Z6841 Body Mass Index (BMI) 40.0 and over, adult: Secondary | ICD-10-CM | POA: Diagnosis not present

## 2022-12-27 DIAGNOSIS — I10 Essential (primary) hypertension: Secondary | ICD-10-CM | POA: Diagnosis not present

## 2022-12-27 DIAGNOSIS — N529 Male erectile dysfunction, unspecified: Secondary | ICD-10-CM | POA: Diagnosis not present

## 2022-12-27 DIAGNOSIS — E1165 Type 2 diabetes mellitus with hyperglycemia: Secondary | ICD-10-CM | POA: Diagnosis not present

## 2022-12-27 DIAGNOSIS — E785 Hyperlipidemia, unspecified: Secondary | ICD-10-CM | POA: Diagnosis not present

## 2023-03-26 DIAGNOSIS — E559 Vitamin D deficiency, unspecified: Secondary | ICD-10-CM | POA: Diagnosis not present

## 2023-03-26 DIAGNOSIS — D631 Anemia in chronic kidney disease: Secondary | ICD-10-CM | POA: Diagnosis not present

## 2023-03-26 DIAGNOSIS — R809 Proteinuria, unspecified: Secondary | ICD-10-CM | POA: Diagnosis not present

## 2023-03-26 DIAGNOSIS — N189 Chronic kidney disease, unspecified: Secondary | ICD-10-CM | POA: Diagnosis not present

## 2023-03-26 DIAGNOSIS — N184 Chronic kidney disease, stage 4 (severe): Secondary | ICD-10-CM | POA: Diagnosis not present

## 2023-03-26 DIAGNOSIS — I129 Hypertensive chronic kidney disease with stage 1 through stage 4 chronic kidney disease, or unspecified chronic kidney disease: Secondary | ICD-10-CM | POA: Diagnosis not present

## 2023-03-26 DIAGNOSIS — E1122 Type 2 diabetes mellitus with diabetic chronic kidney disease: Secondary | ICD-10-CM | POA: Diagnosis not present

## 2023-03-26 DIAGNOSIS — I509 Heart failure, unspecified: Secondary | ICD-10-CM | POA: Diagnosis not present

## 2023-04-26 DIAGNOSIS — Z6841 Body Mass Index (BMI) 40.0 and over, adult: Secondary | ICD-10-CM | POA: Diagnosis not present

## 2023-04-26 DIAGNOSIS — E785 Hyperlipidemia, unspecified: Secondary | ICD-10-CM | POA: Diagnosis not present

## 2023-04-26 DIAGNOSIS — E1165 Type 2 diabetes mellitus with hyperglycemia: Secondary | ICD-10-CM | POA: Diagnosis not present

## 2023-05-29 DIAGNOSIS — L0201 Cutaneous abscess of face: Secondary | ICD-10-CM | POA: Diagnosis not present

## 2023-06-28 DIAGNOSIS — H25813 Combined forms of age-related cataract, bilateral: Secondary | ICD-10-CM | POA: Diagnosis not present

## 2023-06-28 DIAGNOSIS — H52221 Regular astigmatism, right eye: Secondary | ICD-10-CM | POA: Diagnosis not present

## 2023-06-28 DIAGNOSIS — E119 Type 2 diabetes mellitus without complications: Secondary | ICD-10-CM | POA: Diagnosis not present

## 2023-06-28 DIAGNOSIS — Z7984 Long term (current) use of oral hypoglycemic drugs: Secondary | ICD-10-CM | POA: Diagnosis not present

## 2023-06-28 DIAGNOSIS — H5201 Hypermetropia, right eye: Secondary | ICD-10-CM | POA: Diagnosis not present

## 2023-08-22 DIAGNOSIS — Z0000 Encounter for general adult medical examination without abnormal findings: Secondary | ICD-10-CM | POA: Diagnosis not present

## 2023-08-22 DIAGNOSIS — Z9181 History of falling: Secondary | ICD-10-CM | POA: Diagnosis not present

## 2023-08-23 DIAGNOSIS — N184 Chronic kidney disease, stage 4 (severe): Secondary | ICD-10-CM | POA: Diagnosis not present

## 2023-08-23 DIAGNOSIS — E785 Hyperlipidemia, unspecified: Secondary | ICD-10-CM | POA: Diagnosis not present

## 2023-08-23 DIAGNOSIS — Z6841 Body Mass Index (BMI) 40.0 and over, adult: Secondary | ICD-10-CM | POA: Diagnosis not present

## 2023-08-23 DIAGNOSIS — E1165 Type 2 diabetes mellitus with hyperglycemia: Secondary | ICD-10-CM | POA: Diagnosis not present

## 2023-08-23 DIAGNOSIS — I10 Essential (primary) hypertension: Secondary | ICD-10-CM | POA: Diagnosis not present

## 2023-09-14 DIAGNOSIS — H269 Unspecified cataract: Secondary | ICD-10-CM | POA: Diagnosis not present

## 2023-09-14 DIAGNOSIS — H25812 Combined forms of age-related cataract, left eye: Secondary | ICD-10-CM | POA: Diagnosis not present

## 2023-09-21 DIAGNOSIS — N184 Chronic kidney disease, stage 4 (severe): Secondary | ICD-10-CM | POA: Diagnosis not present

## 2023-09-27 DIAGNOSIS — I130 Hypertensive heart and chronic kidney disease with heart failure and stage 1 through stage 4 chronic kidney disease, or unspecified chronic kidney disease: Secondary | ICD-10-CM | POA: Diagnosis not present

## 2023-09-27 DIAGNOSIS — H25812 Combined forms of age-related cataract, left eye: Secondary | ICD-10-CM | POA: Diagnosis not present

## 2023-09-27 DIAGNOSIS — E785 Hyperlipidemia, unspecified: Secondary | ICD-10-CM | POA: Diagnosis not present

## 2023-09-27 DIAGNOSIS — H5703 Miosis: Secondary | ICD-10-CM | POA: Diagnosis not present

## 2023-09-27 DIAGNOSIS — Z79899 Other long term (current) drug therapy: Secondary | ICD-10-CM | POA: Diagnosis not present

## 2023-09-27 DIAGNOSIS — E1122 Type 2 diabetes mellitus with diabetic chronic kidney disease: Secondary | ICD-10-CM | POA: Diagnosis not present

## 2023-09-27 DIAGNOSIS — H21562 Pupillary abnormality, left eye: Secondary | ICD-10-CM | POA: Diagnosis not present

## 2023-09-27 DIAGNOSIS — N189 Chronic kidney disease, unspecified: Secondary | ICD-10-CM | POA: Diagnosis not present

## 2023-09-27 DIAGNOSIS — I509 Heart failure, unspecified: Secondary | ICD-10-CM | POA: Diagnosis not present

## 2023-09-27 DIAGNOSIS — E1136 Type 2 diabetes mellitus with diabetic cataract: Secondary | ICD-10-CM | POA: Diagnosis not present

## 2023-09-28 DIAGNOSIS — H25811 Combined forms of age-related cataract, right eye: Secondary | ICD-10-CM | POA: Diagnosis not present

## 2023-10-02 DIAGNOSIS — I129 Hypertensive chronic kidney disease with stage 1 through stage 4 chronic kidney disease, or unspecified chronic kidney disease: Secondary | ICD-10-CM | POA: Diagnosis not present

## 2023-10-02 DIAGNOSIS — I509 Heart failure, unspecified: Secondary | ICD-10-CM | POA: Diagnosis not present

## 2023-10-02 DIAGNOSIS — N184 Chronic kidney disease, stage 4 (severe): Secondary | ICD-10-CM | POA: Diagnosis not present

## 2023-10-02 DIAGNOSIS — R809 Proteinuria, unspecified: Secondary | ICD-10-CM | POA: Diagnosis not present

## 2023-10-02 DIAGNOSIS — E559 Vitamin D deficiency, unspecified: Secondary | ICD-10-CM | POA: Diagnosis not present

## 2023-10-02 DIAGNOSIS — E1122 Type 2 diabetes mellitus with diabetic chronic kidney disease: Secondary | ICD-10-CM | POA: Diagnosis not present

## 2023-10-02 DIAGNOSIS — N189 Chronic kidney disease, unspecified: Secondary | ICD-10-CM | POA: Diagnosis not present

## 2023-10-02 DIAGNOSIS — D631 Anemia in chronic kidney disease: Secondary | ICD-10-CM | POA: Diagnosis not present

## 2023-10-18 DIAGNOSIS — I509 Heart failure, unspecified: Secondary | ICD-10-CM | POA: Diagnosis not present

## 2023-10-18 DIAGNOSIS — E1122 Type 2 diabetes mellitus with diabetic chronic kidney disease: Secondary | ICD-10-CM | POA: Diagnosis not present

## 2023-10-18 DIAGNOSIS — I129 Hypertensive chronic kidney disease with stage 1 through stage 4 chronic kidney disease, or unspecified chronic kidney disease: Secondary | ICD-10-CM | POA: Diagnosis not present

## 2023-10-18 DIAGNOSIS — R0602 Shortness of breath: Secondary | ICD-10-CM | POA: Diagnosis not present

## 2023-10-18 DIAGNOSIS — E1136 Type 2 diabetes mellitus with diabetic cataract: Secondary | ICD-10-CM | POA: Diagnosis not present

## 2023-10-18 DIAGNOSIS — Z7982 Long term (current) use of aspirin: Secondary | ICD-10-CM | POA: Diagnosis not present

## 2023-10-18 DIAGNOSIS — N189 Chronic kidney disease, unspecified: Secondary | ICD-10-CM | POA: Diagnosis not present

## 2023-10-18 DIAGNOSIS — I130 Hypertensive heart and chronic kidney disease with heart failure and stage 1 through stage 4 chronic kidney disease, or unspecified chronic kidney disease: Secondary | ICD-10-CM | POA: Diagnosis not present

## 2023-10-18 DIAGNOSIS — Z7984 Long term (current) use of oral hypoglycemic drugs: Secondary | ICD-10-CM | POA: Diagnosis not present

## 2023-10-18 DIAGNOSIS — E785 Hyperlipidemia, unspecified: Secondary | ICD-10-CM | POA: Diagnosis not present

## 2023-10-18 DIAGNOSIS — H21561 Pupillary abnormality, right eye: Secondary | ICD-10-CM | POA: Diagnosis not present

## 2023-10-18 DIAGNOSIS — H25811 Combined forms of age-related cataract, right eye: Secondary | ICD-10-CM | POA: Diagnosis not present

## 2023-10-18 DIAGNOSIS — Z79899 Other long term (current) drug therapy: Secondary | ICD-10-CM | POA: Diagnosis not present

## 2023-11-15 DIAGNOSIS — L0201 Cutaneous abscess of face: Secondary | ICD-10-CM | POA: Diagnosis not present

## 2024-01-17 DIAGNOSIS — L299 Pruritus, unspecified: Secondary | ICD-10-CM | POA: Diagnosis not present

## 2024-01-22 DIAGNOSIS — N184 Chronic kidney disease, stage 4 (severe): Secondary | ICD-10-CM | POA: Diagnosis not present

## 2024-01-22 DIAGNOSIS — N529 Male erectile dysfunction, unspecified: Secondary | ICD-10-CM | POA: Diagnosis not present

## 2024-01-22 DIAGNOSIS — Z6841 Body Mass Index (BMI) 40.0 and over, adult: Secondary | ICD-10-CM | POA: Diagnosis not present

## 2024-01-22 DIAGNOSIS — E785 Hyperlipidemia, unspecified: Secondary | ICD-10-CM | POA: Diagnosis not present

## 2024-01-22 DIAGNOSIS — E1165 Type 2 diabetes mellitus with hyperglycemia: Secondary | ICD-10-CM | POA: Diagnosis not present

## 2024-01-22 DIAGNOSIS — I10 Essential (primary) hypertension: Secondary | ICD-10-CM | POA: Diagnosis not present

## 2024-02-28 DIAGNOSIS — L299 Pruritus, unspecified: Secondary | ICD-10-CM | POA: Diagnosis not present

## 2024-03-25 DIAGNOSIS — N184 Chronic kidney disease, stage 4 (severe): Secondary | ICD-10-CM | POA: Diagnosis not present

## 2024-03-31 DIAGNOSIS — E1122 Type 2 diabetes mellitus with diabetic chronic kidney disease: Secondary | ICD-10-CM | POA: Diagnosis not present

## 2024-03-31 DIAGNOSIS — I129 Hypertensive chronic kidney disease with stage 1 through stage 4 chronic kidney disease, or unspecified chronic kidney disease: Secondary | ICD-10-CM | POA: Diagnosis not present

## 2024-03-31 DIAGNOSIS — R809 Proteinuria, unspecified: Secondary | ICD-10-CM | POA: Diagnosis not present

## 2024-03-31 DIAGNOSIS — I509 Heart failure, unspecified: Secondary | ICD-10-CM | POA: Diagnosis not present

## 2024-03-31 DIAGNOSIS — D631 Anemia in chronic kidney disease: Secondary | ICD-10-CM | POA: Diagnosis not present

## 2024-03-31 DIAGNOSIS — E559 Vitamin D deficiency, unspecified: Secondary | ICD-10-CM | POA: Diagnosis not present

## 2024-03-31 DIAGNOSIS — N184 Chronic kidney disease, stage 4 (severe): Secondary | ICD-10-CM | POA: Diagnosis not present

## 2024-05-22 DIAGNOSIS — E785 Hyperlipidemia, unspecified: Secondary | ICD-10-CM | POA: Diagnosis not present

## 2024-05-22 DIAGNOSIS — E1165 Type 2 diabetes mellitus with hyperglycemia: Secondary | ICD-10-CM | POA: Diagnosis not present

## 2024-05-22 DIAGNOSIS — I10 Essential (primary) hypertension: Secondary | ICD-10-CM | POA: Diagnosis not present

## 2024-05-22 DIAGNOSIS — N184 Chronic kidney disease, stage 4 (severe): Secondary | ICD-10-CM | POA: Diagnosis not present

## 2024-05-22 DIAGNOSIS — Z6841 Body Mass Index (BMI) 40.0 and over, adult: Secondary | ICD-10-CM | POA: Diagnosis not present

## 2024-05-22 DIAGNOSIS — N529 Male erectile dysfunction, unspecified: Secondary | ICD-10-CM | POA: Diagnosis not present

## 2024-07-24 NOTE — Progress Notes (Signed)
 Spencer Ayers                                          MRN: [id number]   07/24/2024   The [facility] Quality Team Specialist reviewed this patient medical record for the purposes of chart review for care gap closure. The following were reviewed: chart review for care gap closure-kidney health evaluation for diabetes:eGFR  and uACR.    [facility] Quality Team

## 2024-07-28 DIAGNOSIS — N184 Chronic kidney disease, stage 4 (severe): Secondary | ICD-10-CM | POA: Diagnosis not present

## 2024-08-13 DIAGNOSIS — I509 Heart failure, unspecified: Secondary | ICD-10-CM | POA: Diagnosis not present

## 2024-08-13 DIAGNOSIS — N184 Chronic kidney disease, stage 4 (severe): Secondary | ICD-10-CM | POA: Diagnosis not present

## 2024-08-13 DIAGNOSIS — E559 Vitamin D deficiency, unspecified: Secondary | ICD-10-CM | POA: Diagnosis not present

## 2024-08-13 DIAGNOSIS — I129 Hypertensive chronic kidney disease with stage 1 through stage 4 chronic kidney disease, or unspecified chronic kidney disease: Secondary | ICD-10-CM | POA: Diagnosis not present

## 2024-08-13 DIAGNOSIS — E1122 Type 2 diabetes mellitus with diabetic chronic kidney disease: Secondary | ICD-10-CM | POA: Diagnosis not present

## 2024-08-13 DIAGNOSIS — D631 Anemia in chronic kidney disease: Secondary | ICD-10-CM | POA: Diagnosis not present

## 2024-09-16 NOTE — Progress Notes (Signed)
 Spencer Ayers                                          MRN: [id number]   09/16/2024   The [facility] Quality Team Specialist reviewed this patient medical record for the purposes of chart review for care gap closure. The following were reviewed: chart review for care gap closure-controlling blood pressure.    [facility] Quality Team
